# Patient Record
Sex: Female | Born: 1975 | Race: White | Hispanic: No | Marital: Married | Smoking: Former smoker
Health system: Southern US, Community
[De-identification: ages and names within clinical notes are randomized; demographics above are authoritative.]

## PROBLEM LIST (undated history)

## (undated) VITALS — BP 112/68 | HR 106 | Temp 98.2°F | Resp 18 | Ht 59.25 in | Wt 156.0 lb

## (undated) DIAGNOSIS — F53 Postpartum depression: Secondary | ICD-10-CM

## (undated) DIAGNOSIS — O99345 Other mental disorders complicating the puerperium: Secondary | ICD-10-CM

## (undated) DIAGNOSIS — O169 Unspecified maternal hypertension, unspecified trimester: Secondary | ICD-10-CM

## (undated) DIAGNOSIS — F419 Anxiety disorder, unspecified: Secondary | ICD-10-CM

## (undated) DIAGNOSIS — F32A Depression, unspecified: Secondary | ICD-10-CM

## (undated) DIAGNOSIS — R87619 Unspecified abnormal cytological findings in specimens from cervix uteri: Secondary | ICD-10-CM

## (undated) DIAGNOSIS — F329 Major depressive disorder, single episode, unspecified: Secondary | ICD-10-CM

## (undated) HISTORY — DX: Anxiety disorder, unspecified: F41.9

## (undated) HISTORY — DX: Other mental disorders complicating the puerperium: O99.345

## (undated) HISTORY — DX: Depression, unspecified: F32.A

## (undated) HISTORY — DX: Major depressive disorder, single episode, unspecified: F32.9

## (undated) HISTORY — DX: Unspecified abnormal cytological findings in specimens from cervix uteri: R87.619

## (undated) HISTORY — DX: Unspecified maternal hypertension, unspecified trimester: O16.9

## (undated) HISTORY — DX: Postpartum depression: F53.0

---

## 2007-10-10 ENCOUNTER — Ambulatory Visit: Payer: Self-pay | Admitting: Family Medicine

## 2007-10-10 DIAGNOSIS — E669 Obesity, unspecified: Secondary | ICD-10-CM

## 2007-10-24 ENCOUNTER — Ambulatory Visit: Payer: Self-pay | Admitting: Family Medicine

## 2007-10-26 DIAGNOSIS — R87619 Unspecified abnormal cytological findings in specimens from cervix uteri: Secondary | ICD-10-CM

## 2007-10-26 HISTORY — DX: Unspecified abnormal cytological findings in specimens from cervix uteri: R87.619

## 2008-08-03 ENCOUNTER — Inpatient Hospital Stay (HOSPITAL_COMMUNITY): Admission: AD | Admit: 2008-08-03 | Discharge: 2008-08-03 | Payer: Self-pay | Admitting: Obstetrics & Gynecology

## 2008-08-13 ENCOUNTER — Inpatient Hospital Stay (HOSPITAL_COMMUNITY): Admission: AD | Admit: 2008-08-13 | Discharge: 2008-08-13 | Payer: Self-pay | Admitting: Obstetrics and Gynecology

## 2008-09-02 ENCOUNTER — Inpatient Hospital Stay (HOSPITAL_COMMUNITY): Admission: AD | Admit: 2008-09-02 | Discharge: 2008-09-02 | Payer: Self-pay | Admitting: Obstetrics and Gynecology

## 2008-09-03 ENCOUNTER — Inpatient Hospital Stay (HOSPITAL_COMMUNITY): Admission: RE | Admit: 2008-09-03 | Discharge: 2008-09-06 | Payer: Self-pay | Admitting: Obstetrics and Gynecology

## 2008-09-09 ENCOUNTER — Encounter: Admission: RE | Admit: 2008-09-09 | Discharge: 2008-10-08 | Payer: Self-pay | Admitting: Obstetrics and Gynecology

## 2008-10-09 ENCOUNTER — Encounter: Admission: RE | Admit: 2008-10-09 | Discharge: 2008-11-08 | Payer: Self-pay | Admitting: Obstetrics and Gynecology

## 2008-11-09 ENCOUNTER — Encounter: Admission: RE | Admit: 2008-11-09 | Discharge: 2008-12-08 | Payer: Self-pay | Admitting: Obstetrics and Gynecology

## 2008-12-09 ENCOUNTER — Encounter: Admission: RE | Admit: 2008-12-09 | Discharge: 2009-01-08 | Payer: Self-pay | Admitting: Obstetrics and Gynecology

## 2009-01-09 ENCOUNTER — Encounter: Admission: RE | Admit: 2009-01-09 | Discharge: 2009-02-08 | Payer: Self-pay | Admitting: Obstetrics and Gynecology

## 2009-02-09 ENCOUNTER — Encounter: Admission: RE | Admit: 2009-02-09 | Discharge: 2009-02-25 | Payer: Self-pay | Admitting: Obstetrics and Gynecology

## 2009-12-15 ENCOUNTER — Inpatient Hospital Stay (HOSPITAL_COMMUNITY): Admission: AD | Admit: 2009-12-15 | Discharge: 2009-12-19 | Payer: Self-pay | Admitting: Obstetrics and Gynecology

## 2011-03-27 LAB — COMPREHENSIVE METABOLIC PANEL
AST: 14 U/L (ref 0–37)
Alkaline Phosphatase: 59 U/L (ref 39–117)
Alkaline Phosphatase: 75 U/L (ref 39–117)
BUN: 4 mg/dL — ABNORMAL LOW (ref 6–23)
BUN: 5 mg/dL — ABNORMAL LOW (ref 6–23)
CO2: 25 mEq/L (ref 19–32)
CO2: 28 mEq/L (ref 19–32)
Chloride: 104 mEq/L (ref 96–112)
Chloride: 104 mEq/L (ref 96–112)
Creatinine, Ser: 0.42 mg/dL (ref 0.4–1.2)
GFR calc Af Amer: >60 mL/min (ref >60)
GFR calc non Af Amer: >60 mL/min (ref >60)
GFR calc non Af Amer: >60 mL/min (ref >60)
Glucose, Bld: 94 mg/dL (ref 70–99)
Potassium: 3.6 mEq/L (ref 3.5–5.1)
Potassium: 3.7 mEq/L (ref 3.5–5.1)
Total Bilirubin: 0.2 mg/dL — ABNORMAL LOW (ref 0.3–1.2)
Total Bilirubin: 0.4 mg/dL (ref 0.3–1.2)

## 2011-03-27 LAB — CBC
HCT: 27.2 % — ABNORMAL LOW (ref 36.0–46.0)
HCT: 32.3 % — ABNORMAL LOW (ref 36.0–46.0)
Hemoglobin: 11.1 g/dL — ABNORMAL LOW (ref 12.0–15.0)
MCHC: 34.4 g/dL (ref 30.0–36.0)
MCV: 93 fL (ref 78.0–100.0)
MCV: 94.1 fL (ref 78.0–100.0)
Platelets: 139 10*3/uL — ABNORMAL LOW (ref 150–400)
RBC: 2.89 MIL/uL — ABNORMAL LOW (ref 3.87–5.11)
RBC: 2.95 MIL/uL — ABNORMAL LOW (ref 3.87–5.11)
RBC: 3.46 MIL/uL — ABNORMAL LOW (ref 3.87–5.11)
RDW: 12.6 % (ref 11.5–15.5)
RDW: 13 % (ref 11.5–15.5)
WBC: 10.4 10*3/uL (ref 4.0–10.5)
WBC: 15.6 10*3/uL — ABNORMAL HIGH (ref 4.0–10.5)
WBC: 8.4 10*3/uL (ref 4.0–10.5)

## 2011-03-27 LAB — URINALYSIS, ROUTINE W REFLEX MICROSCOPIC
Glucose, UA: NEGATIVE mg/dL
Ketones, ur: 15 mg/dL — AB
pH: 7.5 (ref 5.0–8.0)

## 2011-03-27 LAB — URIC ACID: Uric Acid, Serum: 3.3 mg/dL (ref 2.4–7.0)

## 2011-03-27 LAB — LACTATE DEHYDROGENASE: LDH: 121 U/L (ref 94–250)
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
## 2011-05-09 NOTE — Discharge Summary (Signed)
[name], [name]             ACCOUNT NO.:  [id number]   MEDICAL RECORD NO.:  [id number]          PATIENT TYPE:  INP   LOCATION:  9129                          [facility]ILITY:  [facility]   PHYSICIAN:  Crist Fat. Rivard, M.D. DATE OF BIRTH:  1976/01/13   DATE OF ADMISSION:  09/03/2008  DATE OF DISCHARGE:  09/06/2008                               DISCHARGE SUMMARY   ADMITTING DIAGNOSES:  1. Intrauterine pregnancy at 39 weeks.  2. Pregnancy-induced hypertension with the patient on labetalol 600 mg      p.o. t.i.d. prior to delivery.  3. Breech presentation   DISCHARGE DIAGNOSES:  1. Term intrauterine gestation.  2. Gestational hypertension.  3. Footling breech presentation.   PROCEDURE:  1. Primary low transverse cesarean section.  2. Spinal anesthesia.   HOSPITAL COURSE:  Ms. [name] is a 35 year old gravida 1, para 0 who  presented at 61 weeks' gestation for scheduled cesarean section  secondary to breech presentation.  The patient has been followed at  [facility] with the pregnancy complicated by gestational  hypertension and on labetalol 600 mg p.o. t.i.d.  The patient was taken  to the operating room where a primary low transverse cesarean section  was performed by Dr. Zack Seal under spinal anesthesia.  Findings were  a viable female by the name  [name] called [name], weight 6 pounds 13  ounces.  Apgars were 8 and 9.  The patient tolerated procedure well and  was taken to recovery in good condition.  Infant was taken to the full-  term nursery.  In the immediate postoperative period, the patient's  blood pressure was 167/114.  She received labetalol IV, blood pressure  then was 171/102.  She then got 20 mg of labetalol, blood pressure then  was 162/100, her diastolics remained between 100 and 114.  Labetalol was  increased to 800 mg p.o. t.i.d. and she received a dose of hydralazine 5  mg IV.  Her blood pressure seemed to be under better control subsequent  to  that.  By postop day #1, the patient was complaining of a mild  headache but had no other PIH symptoms.  Her blood pressures were in the  130s-170s over 73-114.  Hemoglobin was 9.6 down from 11.6, white blood  cell count was 11.1 and platelet count was 133.  It had been 158 prior  to delivery.  She was having good urinary output.  Her incision was  clean, dry and intact.  Her uterus was firm.  She was breast-feeding.  She did have a history of depression in the past.  By later that day  blood pressures run in 160s/90s.  Dr. Estanislado Pandy saw the patient and had  Procardia 30 mg XL daily.  Through the rest the afternoon of the  September 04, 2008, she began to have more trouble with blurred vision  and headache.  Blood pressure was 159/111.  A normal saline lock was  initiated and Apresoline was given IV, urine was negative for protein.  By the morning of the September 05, 2008, her blood pressures were still  in the 140s-160s over  90s-111.  She was up ad lib, tolerating p.o. diet,  ambulating and voiding without difficulty.  Her physical exam was within  normal limits.  She had a comprehensive metabolic panel done on the  evening of September 04, 2008, showing uric acid of 5.2, SGOT of 22,  SGPT of 19, platelet count was 163.  She was offered Zoloft for  depression; however, she declined this.  Her Procardia dose was  increased to 90 mg XL p.o. daily and labetalol 800 mg p.o. t.i.d.  By  the morning of September 06, 2008, her blood pressures were 116/78,  131/80 and 142/98.  Her other vital signs were stable.  Her physical  exam was within normal limits.  She was doing well, was up ad lib.  Her  incision was clean, dry and intact.  She was breast-feeding well.  She  was deemed to receive full benefit of her hospital stay and was  discharged home after consult with Dr. Estanislado Pandy.   DISCHARGE INSTRUCTIONS:  Per [facility] handout as well as PIH  precautions were discussed.   DISCHARGE  MEDICATIONS:  1. Procardia 90 mg XL 1 p.o. daily.  2. Labetalol 800 mg 1 p.o. t.i.d.  3. Percocet 5/325 1-2 p.o. q.3-4 h. p.r.n. pain.  4. Motrin 600 mg p.o. q.6 h. p.r.n. pain.  She was undecided regarding      birth control.   DISCHARGE FOLLOWUP:  Will occur by [employer] nurse on Tuesday,  September 08, 2008, and at [facility] p.r.n. and in 6 weeks.  The [employer] nurse will call the office with a blood pressure  results.      Renaldo Reel Emilee Hero, C.N.M.      Crist Fat Rivard, M.D.  Electronically Signed    VLL/MEDQ  D:  09/06/2008  T:  09/07/2008  Job:  [id number]

## 2011-05-09 NOTE — Op Note (Signed)
[name], [name]             ACCOUNT NO.:  [id number]   MEDICAL RECORD NO.:  [id number]          PATIENT TYPE:  MAT   LOCATION:  MATC                          [facility]ILITY:  [facility]   PHYSICIAN:  Janine Limbo, M.D.DATE OF BIRTH:  February 23, 1976   DATE OF PROCEDURE:  DATE OF DISCHARGE:                               OPERATIVE REPORT   PREOPERATIVE DIAGNOSES:  1. Term intrauterine gestation.  2. Gestational hypertension.  3. Breech presentation.   POSTOPERATIVE DIAGNOSES:  1. Term intrauterine gestation.  2. Gestational hypertension.  3. Footling breech presentation.   PROCEDURE:  Primary low transverse cesarean section.   SURGEON:  Janine Limbo, M.D.   FIRST ASSISTANT:  Renaldo Reel. Emilee Hero, C.N.M.   ANESTHETIC:  Spinal.   DISPOSITION:  [name] is a 35 year old female, gravida 1, para 0,  who presents at [redacted] weeks gestation.  The patient has been followed at  the [facility] and Gynecology division of [facility] for women.  This pregnancy has been complicated by  gestational hypertension.  The patient currently takes labetalol 600 mg  3 times each day.  The patient understands the indications for her  surgical procedure and she accepts the risk of, but not limited to,  anesthetic complications, bleeding, infection, and possible damage to  the surrounding organs.   FINDINGS:  A 6 pound 13 ounce female infant [name]) was delivered  from a double footling breech presentation.  The Apgars were 8 at 1  minutes and 9 at 5 minutes.  The uterus, fallopian tubes, and the  ovaries were normal for the gravid state.   PROCEDURE:  The patient was seen preoperatively and an ultrasound was  performed.  She was noted to have a single intrauterine gestation with a  breech presentation.  The fetal head was in the left upper quadrant.  The amniotic fluid volume was normal.  The placenta appeared normal.  Fetal heart motions were normal.  The patient was  then taken to the  labor and delivery suite.  A spinal anesthetic was given.  The patient's  abdomen, perineum, and outer vagina were prepped with multiple layers of  Betadine.  A Foley catheter was placed in the bladder.  The patient was  sterilely draped.  The lower abdomen was injected with 10 mL of 0.5%  Marcaine with epinephrine.  A low transverse incision was made in the  abdomen and carried sharply through the subcutaneous tissue, the fascia,  and the anterior peritoneum.  An incision was made in the lower uterine  segment and extended in a low transverse fashion.  The infant was  delivered from a double footling breech presentation without difficulty.  The mouth and nose were suctioned.  The cord was clamped and cut and the  infant was handed to the waiting pediatric team.  Routine cord blood  studies were obtained.  The placenta was removed.  The uterine cavity  was cleaned of amniotic fluid, clotted blood, and membranes.  The  uterine incision was closed using a running locking suture of 2-0 Vicryl  followed by an imbricating suture of 2-0  Vicryl.  The pelvis was  vigorously irrigated.  Hemostasis was adequate.  The anterior peritoneum  and then abdominal musculature were reapproximated in the midline using  2-0 Vicryl.  The fascia was closed using a running suture of 0-Vicryl  followed by three interrupted sutures of 0-Vicryl.  The subcutaneous  layer was closed using interrupted sutures of 2-0 Vicryl.  The skin was  reapproximated using a subcuticular suture of 2-0 Monocryl.  Sponge,  needle, instrument counts were correct on two occasions.  The estimated  blood loss for the procedure was 500 mL.  The patient tolerated her  procedure well.  She was taken to the recovery room in stable condition.  The infant was taken to the full-term nursery in stable condition.  The  placenta was sent to labor and delivery.  Wound classification is 2.  There were no complications and no  equipment failures.      Janine Limbo, M.D.  Electronically Signed     AVS/MEDQ  D:  09/03/2008  T:  09/03/2008  Job:  [id number]

## 2011-09-22 LAB — COMPREHENSIVE METABOLIC PANEL
AST: 22
Albumin: 2.9 — ABNORMAL LOW
BUN: 7
Calcium: 8.9
Chloride: 106
Creatinine, Ser: 0.51
GFR calc Af Amer: >60
Total Protein: 6.4

## 2011-09-22 LAB — CBC
HCT: 33.7 — ABNORMAL LOW
MCV: 94.6
Platelets: 173
RDW: 12.3
WBC: 12.2 — ABNORMAL HIGH

## 2011-09-22 LAB — URIC ACID: Uric Acid, Serum: 3.9

## 2011-09-27 LAB — CBC
HCT: 28.3 — ABNORMAL LOW
HCT: 29.4 — ABNORMAL LOW
MCHC: 34.1
MCV: 95.1
MCV: 95.1
MCV: 95.3
Platelets: 158
Platelets: 163
RBC: 2.98 — ABNORMAL LOW
RBC: 3.57 — ABNORMAL LOW
RDW: 12.7
WBC: 11.1 — ABNORMAL HIGH
WBC: 11.1 — ABNORMAL HIGH

## 2011-09-27 LAB — COMPREHENSIVE METABOLIC PANEL
ALT: 23
AST: 25
Albumin: 2.6 — ABNORMAL LOW
Albumin: 2.9 — ABNORMAL LOW
Alkaline Phosphatase: 68
BUN: 6
CO2: 22
Calcium: 8.8
Calcium: 8.8
Chloride: 101
Creatinine, Ser: 0.53
Creatinine, Ser: 0.56
GFR calc Af Amer: >60
Potassium: 3.8
Sodium: 133 — ABNORMAL LOW
Total Bilirubin: 0.3
Total Protein: 5.8 — ABNORMAL LOW

## 2011-09-27 LAB — URINALYSIS, ROUTINE W REFLEX MICROSCOPIC
Glucose, UA: NEGATIVE
Glucose, UA: NEGATIVE
Hgb urine dipstick: NEGATIVE
Ketones, ur: NEGATIVE
Nitrite: NEGATIVE
Specific Gravity, Urine: 1.007
Specific Gravity, Urine: <1.005 — ABNORMAL LOW
pH: 6.5
pH: 6.5

## 2011-09-27 LAB — RAPID HIV SCREEN (WH-MAU): Rapid HIV Screen: NONREACTIVE

## 2011-09-27 LAB — LACTATE DEHYDROGENASE: LDH: 152

## 2011-09-27 LAB — PROTEIN, URINE, RANDOM: Total Protein, Urine: <6

## 2011-09-27 LAB — URIC ACID: Uric Acid, Serum: 5.2

## 2012-01-10 ENCOUNTER — Encounter: Payer: Self-pay | Admitting: Physician Assistant

## 2012-01-10 DIAGNOSIS — F419 Anxiety disorder, unspecified: Secondary | ICD-10-CM | POA: Insufficient documentation

## 2012-01-31 ENCOUNTER — Telehealth: Payer: Self-pay | Admitting: Physician Assistant

## 2012-01-31 MED ORDER — ALPRAZOLAM 0.25 MG PO TABS: 0.2500 mg | ORAL_TABLET | Freq: Two times a day (BID) | ORAL | Status: AC | PRN

## 2012-01-31 NOTE — Telephone Encounter (Signed)
Ok to refill x 1.  [name]

## 2012-03-30 ENCOUNTER — Other Ambulatory Visit: Payer: Self-pay | Admitting: Physician Assistant

## 2012-04-04 ENCOUNTER — Telehealth: Payer: Self-pay

## 2012-04-04 MED ORDER — ALPRAZOLAM 0.25 MG PO TABS: 0.2500 mg | ORAL_TABLET | Freq: Two times a day (BID) | ORAL | Status: AC | PRN

## 2012-04-04 NOTE — Telephone Encounter (Signed)
Pharmacy is calling in rx request for xanax .25 #[phone] [facility]

## 2012-04-04 NOTE — Telephone Encounter (Signed)
Chart is at nurses station for review MR [id number]

## 2012-04-04 NOTE — Telephone Encounter (Signed)
Done. Please call in.  [name]

## 2012-04-07 NOTE — Telephone Encounter (Signed)
RX CALLED IN

## 2012-04-24 ENCOUNTER — Telehealth: Payer: Self-pay

## 2012-04-24 NOTE — Telephone Encounter (Signed)
How long has patient been out? We may need to titrate her back up to her regular dose? What is her follow up plan?

## 2012-04-24 NOTE — Telephone Encounter (Signed)
PT WOULD LIKE A REFILL ON GENERIC ZOLOFT CALLED INTO THE [facility] [address]. PT STATES THAT SHE HAS BEEN IN OUT FOR A WHILE NOW.

## 2012-04-24 NOTE — Telephone Encounter (Signed)
Chart pulled to PA 

## 2012-04-25 MED ORDER — SERTRALINE HCL 100 MG PO TABS: 100.0000 mg | ORAL_TABLET | Freq: Every day | ORAL | Status: DC

## 2012-04-25 NOTE — Telephone Encounter (Signed)
Pt CB and reported that she has only missed one day bc her pharmacy gave her some to hold her over. Pt stated that she knows she is due for f/up. I told her that we well RF 1 mos but then she needs OV for more. Pt agreed and transferred her to appt center for appt.

## 2012-04-25 NOTE — Telephone Encounter (Signed)
LMOM to CB. 

## 2012-05-05 ENCOUNTER — Other Ambulatory Visit: Payer: Self-pay | Admitting: Family Medicine

## 2012-05-23 ENCOUNTER — Encounter: Payer: Self-pay | Admitting: Physician Assistant

## 2012-05-23 ENCOUNTER — Ambulatory Visit (INDEPENDENT_AMBULATORY_CARE_PROVIDER_SITE_OTHER): Payer: BC Managed Care – PPO | Admitting: Physician Assistant

## 2012-05-23 VITALS — BP 132/88 | HR 86 | Temp 97.6°F | Resp 16 | Ht 59.0 in | Wt 157.6 lb

## 2012-05-23 DIAGNOSIS — F3281 Premenstrual dysphoric disorder: Secondary | ICD-10-CM

## 2012-05-23 DIAGNOSIS — F329 Major depressive disorder, single episode, unspecified: Secondary | ICD-10-CM

## 2012-05-23 DIAGNOSIS — I10 Essential (primary) hypertension: Secondary | ICD-10-CM

## 2012-05-23 DIAGNOSIS — N943 Premenstrual tension syndrome: Secondary | ICD-10-CM

## 2012-05-23 MED ORDER — BUPROPION HCL ER (XL) 150 MG PO TB24: 150.0000 mg | ORAL_TABLET | Freq: Every day | ORAL | Status: DC

## 2012-05-23 MED ORDER — SERTRALINE HCL 100 MG PO TABS: 100.0000 mg | ORAL_TABLET | Freq: Every day | ORAL | Status: DC

## 2012-05-23 MED ORDER — NORETHINDRONE 0.35 MG PO TABS: 1.0000 | ORAL_TABLET | Freq: Every day | ORAL | Status: DC

## 2012-05-23 MED ORDER — ALPRAZOLAM 0.25 MG PO TABS: 0.2500 mg | ORAL_TABLET | Freq: Every evening | ORAL | Status: DC | PRN

## 2012-05-23 NOTE — Progress Notes (Signed)
  Subjective:    Patient ID: [name], female    DOB: [birth year], 36 y.o.   MRN: [id number]  HPI Presents for follow-up of depression, PMDD, weight gain, and HTN. Wants to get off as many meds as she can.  Doesn't feel like meds are helping much, if at all.  Wellbutrin makes her irritable. Exercising regularly has helped a lot, and she reports generally feeling well except for the week before her period when she feels weepy, more irritable and depressed, and even sometimes has SI (though she notes that she has too many people depending on her to consider suicide).  Her husband will lose his job in August, and with it their family health insurance. She's concerned about how she'll be able to manage her health issues after that.   Review of Systems As above.    Objective:   Physical Exam  Vital signs noted. Well-developed, well nourished WF who is awake, alert and oriented, in NAD. HEENT: [place]/AT, sclera and conjunctiva are clear.   Neck: supple, non-tender, no lymphadenopathy, thyromegaly. Heart: RRR, no murmur Lungs: CTA Skin: warm and dry without rash.     Assessment & Plan:   1. PMDD (premenstrual dysphoric disorder)  buPROPion (WELLBUTRIN XL) 150 MG 24 hr tablet, sertraline (ZOLOFT) 100 MG tablet, ALPRAZolam (XANAX) 0.25 MG tablet, norethindrone (MICRONOR,CAMILA,ERRIN) 0.35 MG tablet  2. Depression  buPROPion (WELLBUTRIN XL) 150 MG 24 hr tablet, sertraline (ZOLOFT) 100 MG tablet, ALPRAZolam (XANAX) 0.25 MG tablet  3. HTN (hypertension)  Continue lisinopril and efforts for healthy lifestyle.   Hopefully suppressing ovulation will reduce her PMDD symptoms.  She's also considering a Mirena IUD and I'm happy to refer her to GYN if she elects to go that route.  We'll reduce the Wellbutrin from 300 to 150 mg today, and then either D/C it or reduce her sertraline dose next.  Follow-up in 2 months, sooner if needed.

## 2012-06-22 ENCOUNTER — Other Ambulatory Visit: Payer: Self-pay | Admitting: Family Medicine

## 2012-07-18 ENCOUNTER — Encounter (HOSPITAL_COMMUNITY): Payer: Self-pay | Admitting: *Deleted

## 2012-07-18 ENCOUNTER — Emergency Department (HOSPITAL_COMMUNITY)
Admission: EM | Admit: 2012-07-18 | Discharge: 2012-07-19 | Disposition: A | Discharge status: Other Acute Inpatient Hospital | Payer: BC Managed Care – PPO | Attending: Emergency Medicine | Admitting: Emergency Medicine

## 2012-07-18 DIAGNOSIS — T398X2A Poisoning by other nonopioid analgesics and antipyretics, not elsewhere classified, intentional self-harm, initial encounter: Secondary | ICD-10-CM | POA: Insufficient documentation

## 2012-07-18 DIAGNOSIS — Y92009 Unspecified place in unspecified non-institutional (private) residence as the place of occurrence of the external cause: Secondary | ICD-10-CM | POA: Insufficient documentation

## 2012-07-18 DIAGNOSIS — T46904A Poisoning by unspecified agents primarily affecting the cardiovascular system, undetermined, initial encounter: Secondary | ICD-10-CM | POA: Insufficient documentation

## 2012-07-18 DIAGNOSIS — T50992A Poisoning by other drugs, medicaments and biological substances, intentional self-harm, initial encounter: Secondary | ICD-10-CM | POA: Insufficient documentation

## 2012-07-18 DIAGNOSIS — F341 Dysthymic disorder: Secondary | ICD-10-CM | POA: Insufficient documentation

## 2012-07-18 DIAGNOSIS — T43502A Poisoning by unspecified antipsychotics and neuroleptics, intentional self-harm, initial encounter: Secondary | ICD-10-CM | POA: Insufficient documentation

## 2012-07-18 DIAGNOSIS — T424X4A Poisoning by benzodiazepines, undetermined, initial encounter: Secondary | ICD-10-CM | POA: Insufficient documentation

## 2012-07-18 DIAGNOSIS — T40601A Poisoning by unspecified narcotics, accidental (unintentional), initial encounter: Secondary | ICD-10-CM | POA: Insufficient documentation

## 2012-07-18 DIAGNOSIS — F329 Major depressive disorder, single episode, unspecified: Secondary | ICD-10-CM

## 2012-07-18 DIAGNOSIS — R4589 Other symptoms and signs involving emotional state: Secondary | ICD-10-CM

## 2012-07-18 DIAGNOSIS — F32A Depression, unspecified: Secondary | ICD-10-CM

## 2012-07-18 DIAGNOSIS — Z87891 Personal history of nicotine dependence: Secondary | ICD-10-CM | POA: Insufficient documentation

## 2012-07-18 DIAGNOSIS — T394X2A Poisoning by antirheumatics, not elsewhere classified, intentional self-harm, initial encounter: Secondary | ICD-10-CM | POA: Insufficient documentation

## 2012-07-18 LAB — COMPREHENSIVE METABOLIC PANEL
AST: 13 U/L (ref 0–37)
Albumin: 3.8 g/dL (ref 3.5–5.2)
Alkaline Phosphatase: 36 U/L — ABNORMAL LOW (ref 39–117)
Chloride: 104 mEq/L (ref 96–112)
Creatinine, Ser: 0.72 mg/dL (ref 0.50–1.10)
Potassium: 3.8 mEq/L (ref 3.5–5.1)
Total Bilirubin: 0.3 mg/dL (ref 0.3–1.2)
Total Protein: 6.8 g/dL (ref 6.0–8.3)

## 2012-07-18 LAB — RAPID URINE DRUG SCREEN, HOSP PERFORMED
Amphetamines: NOT DETECTED
Barbiturates: NOT DETECTED
Benzodiazepines: POSITIVE — AB
Cocaine: NOT DETECTED
Tetrahydrocannabinol: POSITIVE — AB

## 2012-07-18 LAB — GLUCOSE, CAPILLARY: Glucose-Capillary: 88 mg/dL (ref 70–99)

## 2012-07-18 LAB — CBC
MCHC: 34.1 g/dL (ref 30.0–36.0)
Platelets: 209 10*3/uL (ref 150–400)
RDW: 12.4 % (ref 11.5–15.5)
WBC: 5.5 10*3/uL (ref 4.0–10.5)

## 2012-07-18 LAB — POCT PREGNANCY, URINE: Preg Test, Ur: NEGATIVE

## 2012-07-18 MED ORDER — SODIUM CHLORIDE 0.9 % IV BOLUS (SEPSIS)
1000.0000 mL | Freq: Once | INTRAVENOUS | Status: AC
  Administered 2012-07-18: 1000 mL via INTRAVENOUS

## 2012-07-18 MED ORDER — ONDANSETRON HCL 4 MG PO TABS: 4.0000 mg | ORAL_TABLET | Freq: Three times a day (TID) | ORAL | Status: DC | PRN

## 2012-07-18 NOTE — ED Provider Notes (Signed)
History    CSN: [id number] Arrival date & time 07/18/12  1415 First MD Initiated Contact with Patient 07/18/12 1501    Chief Complaint  Patient presents with  . Drug Overdose   Patient is a 36 y.o. female presenting with Overdose. The history is provided by the patient.  Drug Overdose Pertinent negatives include no abdominal pain, no headaches and no shortness of breath. Nothing relieves the symptoms. She has tried nothing for the symptoms. The treatment provided no relief.   Pt has been depressed for a long period of time.  She was upset today, no specific inciting factors.  At  9am she took 6 lisinopril, 3 nyquil, 2 hydrocodone and 20 xanax pills.  Pt was found by her husband and he called 911.  Pt was attempting to kill herself.  She has never done this before.  Pt denies chest pain, nausea or vomiting.  She does feel tired and upset still.  Pt is also upset because she has been trying to lose weight by eating right and exercising but she has gained weight.   Past Medical History  Diagnosis Date  . Anxiety and depression   . Abnormal Pap smear of cervix 10/2007    colposcopy 12/2007  . Post partum depression   . HTN complicating peripregnancy, antepartum    No past surgical history on file.  No family history on file.  History  Substance Use Topics  . Smoking status: Former Games developer  . Smokeless tobacco: Not on file  . Alcohol Use: Not on file    OB History    Grav Para Term Preterm Abortions TAB SAB Ect Mult Living                 Review of Systems  Respiratory: Negative for shortness of breath.   Gastrointestinal: Negative for abdominal pain.  Neurological: Negative for headaches.  All other systems reviewed and are negative.   Allergies  Amoxicillin  Home Medications   Current Outpatient Rx  Name Route Sig Dispense Refill  . ALPRAZOLAM 0.25 MG PO TABS Oral Take 0.25 mg by mouth at bedtime as needed.    . BUPROPION HCL ER (XL) 150 MG PO TB24 Oral Take 150 mg by  mouth daily.    Marland Kitchen LISINOPRIL 5 MG PO TABS Oral Take 5 mg by mouth daily.    . NORETHINDRONE 0.35 MG PO TABS Oral Take 1 tablet (0.35 mg total) by mouth daily. 1 Package 11  . SERTRALINE HCL 100 MG PO TABS Oral Take 100 mg by mouth daily.      BP 102/82  Pulse 64  Temp 98 F (36.7 C) (Oral)  Resp 16  SpO2 100%  Physical Exam  Nursing note and vitals reviewed. Constitutional: She appears well-developed and well-nourished. No distress.  HENT:  Head: Normocephalic and atraumatic.  Right Ear: External ear normal.  Left Ear: External ear normal.  Eyes: Conjunctivae are normal. Right eye exhibits no discharge. Left eye exhibits no discharge. No scleral icterus.  Neck: Neck supple. No tracheal deviation present.  Cardiovascular: Normal rate, regular rhythm and intact distal pulses.   Pulmonary/Chest: Effort normal and breath sounds normal. No stridor. No respiratory distress. She has no wheezes. She has no rales.  Abdominal: Soft. Bowel sounds are normal. She exhibits no distension. There is no tenderness. There is no rebound and no guarding.  Musculoskeletal: She exhibits no edema and no tenderness.  Neurological: She is alert. She has normal strength. No sensory deficit. Cranial nerve  deficit:  no gross defecits noted. She exhibits normal muscle tone. She displays no seizure activity. Coordination normal.  Skin: Skin is warm and dry. No rash noted.  Psychiatric: Her speech is not rapid and/or pressured, not delayed and not slurred. She is not agitated, not aggressive, not slowed and not withdrawn. Cognition and memory are not impaired. She expresses inappropriate judgment. She exhibits a depressed mood. She expresses suicidal ideation.       tearful    ED Course  Procedures (including critical care time)  Rate: 66  Rhythm: normal sinus rhythm  QRS Axis: normal  Intervals: normal  ST/T Wave abnormalities: normal  Conduction Disutrbances:none  Narrative Interpretation:   Old EKG  Reviewed: none available  Labs Reviewed  COMPREHENSIVE METABOLIC PANEL - Abnormal; Notable for the following:    Alkaline Phosphatase 36 (*)     All other components within normal limits  URINE RAPID DRUG SCREEN (HOSP PERFORMED) - Abnormal; Notable for the following:    Benzodiazepines POSITIVE (*)     Tetrahydrocannabinol POSITIVE (*)     All other components within normal limits  SALICYLATE LEVEL - Abnormal; Notable for the following:    Salicylate Lvl <2.0 (*)     All other components within normal limits  GLUCOSE, CAPILLARY  CBC  ETHANOL  ACETAMINOPHEN LEVEL  POCT PREGNANCY, URINE   No results found.    MDM  3:44 PM Discussed with poison center.  Would not expect worsening toxicity at this time.  If pt is asymptomatic and vitals are stable pt should be cleared for psychiatric evaluation.  Bupropion could cause delayed seizure and would require 24 hour admission if she took it.  4:40 PM  Pt remains medically stable.  Denies other ingestion.  Clear for psych eval.        Celene Kras, MD 07/18/12 [phone]

## 2012-07-18 NOTE — ED Notes (Signed)
Poison control called. rn gave vitals and answered poison controls questions. Pt alert and oriented x4. Respirations even and unlabored, bilateral symmetrical rise and fall of chest. Skin warm and dry. In no acute distress. Denies needs.

## 2012-07-18 NOTE — ED Notes (Signed)
Per ems pt is from home. Alert and oriented x4, ambulatory. Pt reports she is depressed, tried to hurt herself/kill herself reports 4 hours ago ttaking blood pressure meds x6 pills. nyquil pills x3 pills, xanax x20 pills. Hydrocodone x2 pills. Husbands pt found pt and called ems. 20 g R hand

## 2012-07-18 NOTE — ED Notes (Signed)
Patient has been changed into hospital scrubs. Patient's belongings at nurse's station in the blue zone.

## 2012-07-18 NOTE — ED Notes (Signed)
md at bedside  Pt alert and oriented x4. Respirations even and unlabored, bilateral symmetrical rise and fall of chest. Skin warm and dry. In no acute distress. Denies needs.   

## 2012-07-18 NOTE — ED Notes (Signed)
Pt is still in room ED 13 has not been moved yet. Charting error.

## 2012-07-18 NOTE — Progress Notes (Signed)
Pt confirms she sees [name] at [facility] Updated EPIC

## 2012-07-18 NOTE — ED Notes (Signed)
ZOX:WR60<AV> Expected date:07/18/12<BR> Expected time: 2:09 PM<BR> Means of arrival:Ambulance<BR> Comments:<BR> overdose

## 2012-07-18 NOTE — ED Notes (Signed)
Patient has one bag of belongings in locker 43. 

## 2012-07-18 NOTE — ED Notes (Signed)
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  Had OD attempt today.  Denies HI.  Says she has been depressed with SI for all of her life on and off.

## 2012-07-19 ENCOUNTER — Encounter (HOSPITAL_COMMUNITY): Payer: Self-pay

## 2012-07-19 ENCOUNTER — Inpatient Hospital Stay (HOSPITAL_COMMUNITY)
Admission: EM | Admit: 2012-07-19 | Discharge: 2012-07-22 | DRG: 430 | Disposition: A | Discharge status: Home / Self Care | Payer: BC Managed Care – PPO | Source: Ambulatory Visit | Attending: Psychiatry | Admitting: Psychiatry

## 2012-07-19 DIAGNOSIS — F332 Major depressive disorder, recurrent severe without psychotic features: Principal | ICD-10-CM | POA: Diagnosis present

## 2012-07-19 DIAGNOSIS — F419 Anxiety disorder, unspecified: Secondary | ICD-10-CM

## 2012-07-19 DIAGNOSIS — F411 Generalized anxiety disorder: Secondary | ICD-10-CM

## 2012-07-19 DIAGNOSIS — F3281 Premenstrual dysphoric disorder: Secondary | ICD-10-CM

## 2012-07-19 DIAGNOSIS — T424X5A Adverse effect of benzodiazepines, initial encounter: Secondary | ICD-10-CM | POA: Diagnosis present

## 2012-07-19 DIAGNOSIS — F1210 Cannabis abuse, uncomplicated: Secondary | ICD-10-CM | POA: Clinically undetermined

## 2012-07-19 DIAGNOSIS — Z79899 Other long term (current) drug therapy: Secondary | ICD-10-CM

## 2012-07-19 MED ORDER — ACETAMINOPHEN 325 MG PO TABS: 650.0000 mg | ORAL_TABLET | Freq: Four times a day (QID) | ORAL | Status: DC | PRN

## 2012-07-19 MED ORDER — ALUM & MAG HYDROXIDE-SIMETH 200-200-20 MG/5ML PO SUSP: 30.0000 mL | ORAL | Status: DC | PRN

## 2012-07-19 MED ORDER — GABAPENTIN 100 MG PO CAPS
100.0000 mg | ORAL_CAPSULE | Freq: Three times a day (TID) | ORAL | Status: DC
  Administered 2012-07-20 – 2012-07-22 (×8): 100 mg via ORAL
  Filled 2012-07-19 (×13): qty 1

## 2012-07-19 MED ORDER — HYDROXYZINE HCL 25 MG PO TABS: 25.0000 mg | ORAL_TABLET | Freq: Three times a day (TID) | ORAL | Status: DC | PRN

## 2012-07-19 MED ORDER — SERTRALINE HCL 100 MG PO TABS
100.0000 mg | ORAL_TABLET | Freq: Every day | ORAL | Status: DC
  Administered 2012-07-19 – 2012-07-22 (×4): 100 mg via ORAL
  Filled 2012-07-19 (×5): qty 1

## 2012-07-19 MED ORDER — NORETHINDRONE 0.35 MG PO TABS
1.0000 | ORAL_TABLET | Freq: Every day | ORAL | Status: DC
  Administered 2012-07-20 – 2012-07-22 (×3): 0.35 mg via ORAL

## 2012-07-19 MED ORDER — CHLORPROMAZINE HCL 25 MG PO TABS
25.0000 mg | ORAL_TABLET | Freq: Three times a day (TID) | ORAL | Status: DC
  Administered 2012-07-19 – 2012-07-22 (×10): 25 mg via ORAL
  Filled 2012-07-19 (×15): qty 1

## 2012-07-19 MED ORDER — LISINOPRIL 5 MG PO TABS
5.0000 mg | ORAL_TABLET | Freq: Every day | ORAL | Status: DC
  Administered 2012-07-19 – 2012-07-22 (×4): 5 mg via ORAL
  Filled 2012-07-19 (×6): qty 1

## 2012-07-19 MED ORDER — HYDROXYZINE HCL 50 MG PO TABS
50.0000 mg | ORAL_TABLET | Freq: Every evening | ORAL | Status: DC | PRN
  Administered 2012-07-19 – 2012-07-21 (×3): 50 mg via ORAL
  Filled 2012-07-19 (×10): qty 1

## 2012-07-19 MED ORDER — IBUPROFEN 200 MG PO TABS
400.0000 mg | ORAL_TABLET | Freq: Four times a day (QID) | ORAL | Status: DC | PRN
  Administered 2012-07-19 – 2012-07-20 (×2): 400 mg via ORAL
  Filled 2012-07-19 (×3): qty 2

## 2012-07-19 MED ORDER — NORETHINDRONE-ETH ESTRADIOL 0.4-35 MG-MCG PO TABS: 1.0000 | ORAL_TABLET | Freq: Every day | ORAL | Status: DC

## 2012-07-19 MED ORDER — NORETHINDRONE-ETH ESTRADIOL 0.4-35 MG-MCG PO TABS
1.0000 | ORAL_TABLET | Freq: Every day | ORAL | Status: DC
  Administered 2012-07-19: 1 via ORAL

## 2012-07-19 MED ORDER — MAGNESIUM HYDROXIDE 400 MG/5ML PO SUSP: 30.0000 mL | Freq: Every day | ORAL | Status: DC | PRN

## 2012-07-19 MED ORDER — SERTRALINE HCL 100 MG PO TABS: 100.0000 mg | ORAL_TABLET | Freq: Every day | ORAL | Status: DC

## 2012-07-19 NOTE — BH Assessment (Addendum)
Assessment Note   [name] is an 36 y.o. female.    Patient is a married 36 year old white female. Patient reports feelings of wanting to hurt herself due to severe depression. Patient reports that she has been thinking about hurting herself for a long time.  Patient reports that she wrote a suicide note and took pills in order to kill herself.  Patient reports that she does not remember what pills that she took or how many pills she took.  Patient reports that she is not able to contract for safety. Patient reports that she receives medication for depression from her medical doctor.  Patient reports that she does not have a therapist.  Patient reports a history of depression since she was 36 years old.  Patent reports that she was diagnosed with PMMD 3 years ago.     Patient reports that she has been feelings helpless and overwhelmed with taking care of her two children that are 59 and 42 years old.  Patient reports that she cannot do this anymore.  Patient reports stress her marriage due to financial problems.  Patient denied any psychosis.  Patient denies any prior psychiatric hospitalizations. Patient denies any substance abuse issues.  Patient tested positive for Benzos and her alcohol level was less than 11.   Axis I: Major Depression, single episode Axis II: Deferred Axis III:  Past Medical History  Diagnosis Date  . Anxiety and depression   . Abnormal Pap smear of cervix 10/2007    colposcopy 12/2007  . Post partum depression   . HTN complicating peripregnancy, antepartum    Axis IV: economic problems, other psychosocial or environmental problems, problems related to social environment and problems with primary support group Axis V: 11-20 some danger of hurting self or others possible OR occasionally fails to maintain minimal personal hygiene OR gross impairment in communication  Past Medical History:  Past Medical History  Diagnosis Date  . Anxiety and depression   . Abnormal  Pap smear of cervix 10/2007    colposcopy 12/2007  . Post partum depression   . HTN complicating peripregnancy, antepartum     History reviewed. No pertinent past surgical history.  Family History: History reviewed. No pertinent family history.  Social History:  reports that she has quit smoking. She does not have any smokeless tobacco history on file. Her alcohol and drug histories not on file.  Additional Social History:     CIWA: CIWA-Ar BP: 101/68 mmHg Pulse Rate: 72  COWS:    Allergies:  Allergies  Allergen Reactions  . Amoxicillin Nausea Only    Home Medications:  (Not in a hospital admission)  OB/GYN Status:  No LMP recorded.  General Assessment Data Location of Assessment: [facility] Assessment Services ACT Assessment: Yes Living Arrangements: Spouse/significant other Can pt return to current living arrangement?: Yes Admission Status: Voluntary Is patient capable of signing voluntary admission?: Yes Transfer from: Home Referral Source: Self/Family/Friend     Risk to self Suicidal Ideation: Yes-Currently Present Suicidal Intent: Yes-Currently Present Is patient at risk for suicide?: Yes Suicidal Plan?: Yes-Currently Present Specify Current Suicidal Plan: taking pills Access to Means: Yes Specify Access to Suicidal Means: pills at home What has been your use of drugs/alcohol within the last 12 months?: alcohol and marijuana Previous Attempts/Gestures: No How many times?: 0  Other Self Harm Risks: 0 Triggers for Past Attempts: Family contact;Spouse contact;Unpredictable Intentional Self Injurious Behavior: None Family Suicide History: No Recent stressful life event(s): Conflict (Comment);Financial Problems;Trauma (  Comment) Persecutory voices/beliefs?: No Depression: Yes Depression Symptoms: Despondent;Insomnia;Tearfulness;Isolating;Guilt;Fatigue;Loss of interest in usual pleasures;Feeling worthless/self pity Substance abuse history and/or treatment for  substance abuse?: No Suicide prevention information given to non-admitted patients: Yes  Risk to Others Homicidal Ideation: No Thoughts of Harm to Others: No Current Homicidal Intent: No Current Homicidal Plan: No Access to Homicidal Means: No Identified Victim: N/A History of harm to others?: No Assessment of Violence: None Noted Violent Behavior Description: N/A Does patient have access to weapons?: No Criminal Charges Pending?: No Does patient have a court date: No  Psychosis Hallucinations: None noted Delusions: None noted  Mental Status Report Appear/Hygiene: Disheveled Eye Contact: Poor Motor Activity: Restlessness Speech: Soft Level of Consciousness: Quiet/awake Mood: Depressed;Despair;Sad;Sullen;Worthless, low self-esteem Affect: Blunted;Depressed Anxiety Level: Minimal Thought Processes: Coherent;Relevant Judgement: Unimpaired Orientation: Person;Place;Time;Situation Obsessive Compulsive Thoughts/Behaviors: None  Cognitive Functioning Concentration: Decreased Memory: Recent Intact;Remote Intact IQ: Average Insight: Fair Impulse Control: Fair Appetite: Poor Weight Loss: 0  Weight Gain: 0  Sleep: Decreased Total Hours of Sleep: 4  Vegetative Symptoms: Staying in bed  ADLScreening [facility] Assessment Services) Patient's cognitive ability adequate to safely complete daily activities?: Yes Patient able to express need for assistance with ADLs?: Yes Independently performs ADLs?: Yes  Abuse/Neglect [facility]) Physical Abuse: Denies Verbal Abuse: Denies Sexual Abuse: Denies  Prior Inpatient Therapy Prior Inpatient Therapy: No  Prior Outpatient Therapy Prior Outpatient Therapy: No  ADL Screening (condition at time of admission) Patient's cognitive ability adequate to safely complete daily activities?: Yes Patient able to express need for assistance with ADLs?: Yes Independently performs ADLs?: Yes       Abuse/Neglect Assessment (Assessment to be complete  while patient is alone) Physical Abuse: Denies Verbal Abuse: Denies Sexual Abuse: Denies Values / Beliefs Cultural Requests During Hospitalization: None Spiritual Requests During Hospitalization: None        Additional Information 1:1 In Past 12 Months?: No CIRT Risk: No Elopement Risk: No Does patient have medical clearance?: Yes     Disposition: [facility] placement.  Disposition Disposition of Patient: Referred to Patient referred to:  [facility])  On Site Evaluation by:   Reviewed with Physician:     Phillip Heal LaVerne 07/19/2012 1:30 AM

## 2012-07-19 NOTE — ED Notes (Signed)
Pt very tearful and feels guilty about attempting suicide. She also said she's very sad and misses her family very much. She had some general questions about [facility] and asked to see the CSW as well.

## 2012-07-19 NOTE — Progress Notes (Signed)
[facility] MD Progress Note  07/19/2012 11:51 PM  Diagnosis:   Axis I: Anxiety Disorder NOS, Major Depression, Recurrent severe and Benzodiazepine causing advere reaction in therapeutic use Axis II: Deferred Axis III:  Past Medical History  Diagnosis Date  . Anxiety and depression   . Abnormal Pap smear of cervix 10/2007    colposcopy 12/2007  . Post partum depression   . HTN complicating peripregnancy, antepartum    Axis IV: other psychosocial or environmental problems, problems related to social environment and problems with primary support group Axis V: 31-40 impairment in reality testing  ADL's:  Intact  Sleep: Poor  Appetite:  Fair  Suicidal Ideation:  Plan:  No Intent:  No Means:  No Homicidal Ideation:  Plan:  No Intent:  No Means:  No  AEB (as evidenced by):  Mental Status Examination/Evaluation: Objective:  Appearance: Casual  Eye Contact::  Good  Speech:  Clear and Coherent  Volume:  Normal  Mood:  Anxious, Depressed, Hopeless, Irritable and Worthless  Affect:  Congruent and Tearful  Thought Process:  Coherent  Orientation:  Full  Thought Content:  WDL  Suicidal Thoughts:  Yes.  without intent/plan  Homicidal Thoughts:  No  Memory:  Immediate;   Fair Recent;   Fair Remote;   Fair  Judgement:  Impaired  Insight:  Lacking  Psychomotor Activity:  Normal  Concentration:  Good  Recall:  Fair  Akathisia:  No  Handed:  Right  AIMS (if indicated):     Assets:  Communication Skills Desire for Improvement  Sleep:      Vital Signs:Blood pressure 139/97, pulse 74, temperature 98.2 F (36.8 C), temperature source Oral, resp. rate 16, height 4' 11.25" (1.505 m), weight 70.761 kg (156 lb), last menstrual period 06/23/2012, SpO2 100.00%. Current Medications: Current Facility-Administered Medications  Medication Dose Route Frequency Provider Last Rate Last Dose  . alum & mag hydroxide-simeth (MAALOX/MYLANTA) 200-200-20 MG/5ML suspension 30 mL  30 mL Oral Q4H PRN  Mike Craze, MD      . chlorproMAZINE (THORAZINE) tablet 25 mg  25 mg Oral TID Mike Craze, MD   25 mg at 07/19/12 2143  . gabapentin (NEURONTIN) capsule 100 mg  100 mg Oral TID Mike Craze, MD      . hydrOXYzine (ATARAX/VISTARIL) tablet 25 mg  25 mg Oral TID PRN Mike Craze, MD      . hydrOXYzine (ATARAX/VISTARIL) tablet 50 mg  50 mg Oral QHS,MR X 1 Mike Craze, MD   50 mg at 07/19/12 2143  . ibuprofen (ADVIL,MOTRIN) tablet 400 mg  400 mg Oral Q6H PRN Mike Craze, MD   400 mg at 07/19/12 1155  . lisinopril (PRINIVIL,ZESTRIL) tablet 5 mg  5 mg Oral Daily Mike Craze, MD   5 mg at 07/19/12 1153  . magnesium hydroxide (MILK OF MAGNESIA) suspension 30 mL  30 mL Oral Daily PRN Mike Craze, MD      . norethindrone (MICRONOR,CAMILA,ERRIN) 0.35 MG tablet 0.35 mg  1 tablet Oral Daily Mike Craze, MD      . norethindrone-ethinyl estradiol Brooks Sailors) tablet 1 tablet  1 tablet Oral Daily Larena Sox, MD   1 tablet at 07/19/12 2200  . sertraline (ZOLOFT) tablet 100 mg  100 mg Oral Daily Mike Craze, MD   100 mg at 07/19/12 1154  . DISCONTD: acetaminophen (TYLENOL) tablet 650 mg  650 mg Oral Q6H PRN Mike Craze, MD      .  DISCONTD: norethindrone-ethinyl estradiol (OVCON-35,BALZIVA,BRIELLYN) tablet 1 tablet  1 tablet Oral Daily Larena Sox, MD      . DISCONTD: sertraline (ZOLOFT) tablet 100 mg  100 mg Oral Daily Mike Craze, MD       Facility-Administered Medications Ordered in Other Encounters  Medication Dose Route Frequency Provider Last Rate Last Dose  . DISCONTD: ondansetron (ZOFRAN) tablet 4 mg  4 mg Oral Q8H PRN Celene Kras, MD        Lab Results:  Results for orders placed during the hospital encounter of 07/18/12 (from the past 48 hour(s))  GLUCOSE, CAPILLARY     Status: Normal   Collection Time   07/18/12  2:35 PM      Component Value Range Comment   Glucose-Capillary 88  70 - 99 mg/dL    Comment 1 Documented in Chart      Comment  2 Notify RN     URINE RAPID DRUG SCREEN (HOSP PERFORMED)     Status: Abnormal   Collection Time   07/18/12  3:01 PM      Component Value Range Comment   Opiates NONE DETECTED  NONE DETECTED    Cocaine NONE DETECTED  NONE DETECTED    Benzodiazepines POSITIVE (*) NONE DETECTED    Amphetamines NONE DETECTED  NONE DETECTED    Tetrahydrocannabinol POSITIVE (*) NONE DETECTED    Barbiturates NONE DETECTED  NONE DETECTED   CBC     Status: Normal   Collection Time   07/18/12  3:15 PM      Component Value Range Comment   WBC 5.5  4.0 - 10.5 K/uL    RBC 4.15  3.87 - 5.11 MIL/uL    Hemoglobin 12.8  12.0 - 15.0 g/dL    HCT 16.1  09.6 - 04.5 %    MCV 90.4  78.0 - 100.0 fL    MCH 30.8  26.0 - 34.0 pg    MCHC 34.1  30.0 - 36.0 g/dL    RDW 40.9  81.1 - 91.4 %    Platelets 209  150 - 400 K/uL   COMPREHENSIVE METABOLIC PANEL     Status: Abnormal   Collection Time   07/18/12  3:15 PM      Component Value Range Comment   Sodium 137  135 - 145 mEq/L    Potassium 3.8  3.5 - 5.1 mEq/L    Chloride 104  96 - 112 mEq/L    CO2 24  19 - 32 mEq/L    Glucose, Bld 88  70 - 99 mg/dL    BUN 13  6 - 23 mg/dL    Creatinine, Ser 7.82  0.50 - 1.10 mg/dL    Calcium 8.7  8.4 - 95.6 mg/dL    Total Protein 6.8  6.0 - 8.3 g/dL    Albumin 3.8  3.5 - 5.2 g/dL    AST 13  0 - 37 U/L    ALT 11  0 - 35 U/L    Alkaline Phosphatase 36 (*) 39 - 117 U/L    Total Bilirubin 0.3  0.3 - 1.2 mg/dL    GFR calc non Af Amer >90  >90 mL/min    GFR calc Af Amer >90  >90 mL/min   ETHANOL     Status: Normal   Collection Time   07/18/12  3:15 PM      Component Value Range Comment   Alcohol, Ethyl (B) <11  0 - 11 mg/dL   ACETAMINOPHEN LEVEL  Status: Normal   Collection Time   07/18/12  3:15 PM      Component Value Range Comment   Acetaminophen (Tylenol), Serum <15.0  10 - 30 ug/mL   POCT PREGNANCY, URINE     Status: Normal   Collection Time   07/18/12  3:35 PM      Component Value Range Comment   Preg Test, Ur NEGATIVE   NEGATIVE   SALICYLATE LEVEL     Status: Abnormal   Collection Time   07/18/12  3:46 PM      Component Value Range Comment   Salicylate Lvl <2.0 (*) 2.8 - 20.0 mg/dL     Physical Findings: AIMS: Facial and Oral Movements Muscles of Facial Expression: None, normal Lips and Perioral Area: None, normal Jaw: None, normal Tongue: None, normal,Extremity Movements Upper (arms, wrists, hands, fingers): None, normal Lower (legs, knees, ankles, toes): None, normal, Trunk Movements Neck, shoulders, hips: None, normal, Overall Severity Severity of abnormal movements (highest score from questions above): None, normal Incapacitation due to abnormal movements: None, normal Patient's awareness of abnormal movements (rate only patient's report): No Awareness, Dental Status Current problems with teeth and/or dentures?: No Does patient usually wear dentures?: No  CIWA:  CIWA-Ar Total: 0  COWS:  COWS Total Score: 0   Treatment Plan Summary: Daily contact with patient to assess and evaluate symptoms and progress in treatment Medication management Mood/anxiety less than 3/10 where the scale is 1 is the best and 10 is the worst  Plan: Admit, start Thorazine for anxiety, Vistaril for anxiety and insomnia, continue Zoloft for depression, and Motrin for pain.  Also start Neurontin for detox/withdrawal seizure prevention.  Have pt attend 12 Step mtgs on [address].  Discussed the risks, benefits, and probable clinical course with and without treatment.  Pt is agreeable to the current course of treatment.  Sivan Cuello 07/19/2012, 11:51 PM

## 2012-07-19 NOTE — ED Notes (Signed)
Security contacted to transport pt across to [facility]

## 2012-07-19 NOTE — Progress Notes (Signed)
Patient seen to assess for discharge planning needs.  She reports admitting to the hospital following a suicide attempt.  Patient reports becoming overwhelmed due to caring for two small children ages three and two and learning husband will soon lose his job.  She advised husband left the home with the children and she took a handful of pills.  She also endorses smoking up to two bowls of THC daily and have two/three drinks of ETOH.  She is not interested in a residential program or CD-IOP.  Patient currently denies SI/HI.  She rates depression at seven, anxiety at four and helplessness/hopelessness at two. She has home and transportation but will need outpatient referral for counseling and medication management.

## 2012-07-19 NOTE — Progress Notes (Signed)
[facility] Group Notes: (Counselor/Nursing/MHT/Case Management/Adjunct) 07/19/2012   @1 :15 -2:30pm Preventing Relapse  Type of Therapy:  Group Therapy  Participation Level:  Active  Participation Quality:  Appropriate, Sharing   Affect:  Blunted    Cognitive:  Appropriate  Insight:  Good  Engagement in Group: Good  Engagement in Therapy:  Good  Modes of Intervention:  Support and Exploration  Summary of Progress/Problems: Kinzy came to group late after meeting with case manager but was immediately engaged. She explored ways that she has given power away in relationships, and related to how difficult it is to be empowered when one has low self-esteem. Gavriella discussed empowerment as taking control of one's decisions and believing in oneself. She examined the role that empowerment plays in relapse prevention by talking about empowering self to make own decisions about treatment and seeking help.  Angus Palms, LCSW 07/19/2012 3:20 PM

## 2012-07-19 NOTE — Progress Notes (Signed)
[facility] Group Notes:  (Counselor/Nursing/MHT/Case Management/Adjunct)  07/19/2012 1:13 PM  Type of Therapy:  Psychoeducational Skills  Participation Level:  Active  Participation Quality:  Appropriate  Affect:  Appropriate  Cognitive:  Alert and Appropriate  Insight:  Good  Engagement in Group:  Good  Engagement in Therapy:  Good  Modes of Intervention:  Education  Summary of Progress/Problems: Patient actively participated in the group. Patient showed great insight and shared in the group.   Ardelle Park O 07/19/2012, 1:13 PM

## 2012-07-19 NOTE — Progress Notes (Signed)
Patient ID: [name], female   DOB: [birth year], 36 y.o.   MRN: [id number] Staff reports patient wants to restart her OCP.  PLAN: Will restart norethindrone-ethinyl estradiol (OVCON-35,BALZIVA,BRIELLYN) tablet- 1 tablet daily.

## 2012-07-19 NOTE — Progress Notes (Signed)
Admission Note  [address]) This is a 36 year old WMM who took a "handful of pills to kill myself". Pt reports that she took Xanax, vicodin, and lisinopril. States that she has two beautiful children at home, ages 2 1/2 and 3 1/2. States that she has trouble taking care of them. Has had depression for two years and it has just gotten worse with the birth of her children. Struggles all day long to try and keep up with the activities of daily living. Pt also uses pot daily to help normalize her and help her cope. Tearful throughout the admission. A) Given support, reassurance and praise. Oriented to the unit. Seen by the doctor and orders received. Encouraged to go to groups and participate in the program. R) Pt states that she is not presently feeling like she wants to hurt herself, but states that if she were to leave she would not be able to maintain her safety.

## 2012-07-19 NOTE — [facility] Suicide Risk Assessment (Signed)
Suicide Risk Assessment  Admission Assessment     Demographic factors:  Assessment Details Time of Assessment: Admission Information Obtained From: Patient Current Mental Status:  Current Mental Status: Suicidal ideation indicated by patient;Suicide plan;Plan includes specific time, place, or method;Self-harm thoughts;Self-harm behaviors;Intention to act on suicide plan Loss Factors:  Loss Factors: Decline in physical health Historical Factors:  Historical Factors: Family history of mental illness or substance abuse;Impulsivity Risk Reduction Factors:  Risk Reduction Factors: Responsible for children under 82 years of age;Living with another person, especially a relative;Positive therapeutic relationship  CLINICAL FACTORS:   Severe Anxiety and/or Agitation Depression:   Hopelessness Alcohol/Substance Abuse/Dependencies Previous Psychiatric Diagnoses and Treatments  COGNITIVE FEATURES THAT CONTRIBUTE TO RISK:  Thought constriction (tunnel vision)    SUICIDE RISK:   Moderate:  Frequent suicidal ideation with limited intensity, and duration, some specificity in terms of plans, no associated intent, good self-control, limited dysphoria/symptomatology, some risk factors present, and identifiable protective factors, including available and accessible social support.  Reason for hospitalization: .Suicide attempt Diagnosis:   Axis I: Anxiety Disorder NOS, Major Depression, Recurrent severe and Benzodiazepine causing advere reaction in therapeutic use Axis II: Deferred Axis III:  Past Medical History Diagnosis Date . Anxiety and depression  . Abnormal Pap smear of cervix 10/2007   colposcopy 12/2007 . Post partum depression  . HTN complicating peripregnancy, antepartum   Axis IV: other psychosocial or environmental problems, problems related to social environment and problems with primary support group Axis V: 31-40 impairment in reality testing  ADL's:  Intact  Sleep:  Poor  Appetite:  Fair  Suicidal Ideation:  Plan:  No Intent:  No Means:  No Homicidal Ideation:  Plan:  No Intent:  No Means:  No  AEB (as evidenced by):  Mental Status Examination/Evaluation: Objective:  Appearance: Casual Eye Contact::  Good Speech:  Clear and Coherent Volume:  Normal Mood:  Anxious, Depressed, Hopeless, Irritable and Worthless Affect:  Congruent and Tearful Thought Process:  Coherent Orientation:  Full Thought Content:  WDL Suicidal Thoughts:  Yes.  without intent/plan Homicidal Thoughts:  No Memory:  Immediate;   Fair Recent;   Fair Remote;   Fair Judgement:  Impaired Insight:  Lacking Psychomotor Activity:  Normal Concentration:  Good Recall:  Fair Akathisia:  No Handed:  Right AIMS (if indicated):    Assets:  Communication Skills Desire for Improvement Sleep:     Vital Signs:Blood pressure 139/97, pulse 74, temperature 98.2 F (36.8 C), temperature source Oral, resp. rate 16, height 4' 11.25" (1.505 m), weight 70.761 kg (156 lb), last menstrual period 06/23/2012, SpO2 100.00%. Current Medications: Current Facility-Administered Medications Medication Dose Route Frequency Provider Last Rate Last Dose . alum & mag hydroxide-simeth (MAALOX/MYLANTA) 200-200-20 MG/5ML suspension 30 mL  30 mL Oral Q4H PRN Mike Craze, MD     . chlorproMAZINE (THORAZINE) tablet 25 mg  25 mg Oral TID Mike Craze, MD   25 mg at 07/19/12 2143 . gabapentin (NEURONTIN) capsule 100 mg  100 mg Oral TID Mike Craze, MD     . hydrOXYzine (ATARAX/VISTARIL) tablet 25 mg  25 mg Oral TID PRN Mike Craze, MD     . hydrOXYzine (ATARAX/VISTARIL) tablet 50 mg  50 mg Oral QHS,MR X 1 Mike Craze, MD   50 mg at 07/19/12 2143 . ibuprofen (ADVIL,MOTRIN) tablet 400 mg  400 mg Oral Q6H PRN Mike Craze, MD   400 mg at 07/19/12 1155 . lisinopril (PRINIVIL,ZESTRIL) tablet 5 mg  5 mg  Oral Daily Mike Craze, MD   5 mg at 07/19/12 1153 . magnesium hydroxide (MILK OF  MAGNESIA) suspension 30 mL  30 mL Oral Daily PRN Mike Craze, MD     . norethindrone (MICRONOR,CAMILA,ERRIN) 0.35 MG tablet 0.35 mg  1 tablet Oral Daily Mike Craze, MD     . norethindrone-ethinyl estradiol Brooks Sailors) tablet 1 tablet  1 tablet Oral Daily Larena Sox, MD   1 tablet at 07/19/12 2200 . sertraline (ZOLOFT) tablet 100 mg  100 mg Oral Daily Mike Craze, MD   100 mg at 07/19/12 1154 . DISCONTD: acetaminophen (TYLENOL) tablet 650 mg  650 mg Oral Q6H PRN Mike Craze, MD     . DISCONTD: norethindrone-ethinyl estradiol (OVCON-35,BALZIVA,BRIELLYN) tablet 1 tablet  1 tablet Oral Daily Larena Sox, MD     . DISCONTD: sertraline (ZOLOFT) tablet 100 mg  100 mg Oral Daily Mike Craze, MD      Facility-Administered Medications Ordered in Other Encounters Medication Dose Route Frequency Provider Last Rate Last Dose . DISCONTD: ondansetron (ZOFRAN) tablet 4 mg  4 mg Oral Q8H PRN Celene Kras, MD       Lab Results:  Results for orders placed during the hospital encounter of 07/18/12 (from the past 48 hour(s)) GLUCOSE, CAPILLARY     Status: Normal  Collection Time  07/18/12  2:35 PM     Component Value Range Comment  Glucose-Capillary 88  70 - 99 mg/dL   Comment 1 Documented in Chart     Comment 2 Notify RN    URINE RAPID DRUG SCREEN (HOSP PERFORMED)     Status: Abnormal  Collection Time  07/18/12  3:01 PM     Component Value Range Comment  Opiates NONE DETECTED  NONE DETECTED   Cocaine NONE DETECTED  NONE DETECTED   Benzodiazepines POSITIVE (*) NONE DETECTED   Amphetamines NONE DETECTED  NONE DETECTED   Tetrahydrocannabinol POSITIVE (*) NONE DETECTED   Barbiturates NONE DETECTED  NONE DETECTED  CBC     Status: Normal  Collection Time  07/18/12  3:15 PM     Component Value Range Comment  WBC 5.5  4.0 - 10.5 K/uL   RBC 4.15  3.87 - 5.11 MIL/uL   Hemoglobin 12.8  12.0 - 15.0 g/dL   HCT 16.1  09.6 - 04.5 %   MCV 90.4  78.0 - 100.0 fL   MCH 30.8   26.0 - 34.0 pg   MCHC 34.1  30.0 - 36.0 g/dL   RDW 40.9  81.1 - 91.4 %   Platelets 209  150 - 400 K/uL  COMPREHENSIVE METABOLIC PANEL     Status: Abnormal  Collection Time  07/18/12  3:15 PM     Component Value Range Comment  Sodium 137  135 - 145 mEq/L   Potassium 3.8  3.5 - 5.1 mEq/L   Chloride 104  96 - 112 mEq/L   CO2 24  19 - 32 mEq/L   Glucose, Bld 88  70 - 99 mg/dL   BUN 13  6 - 23 mg/dL   Creatinine, Ser 7.82  0.50 - 1.10 mg/dL   Calcium 8.7  8.4 - 95.6 mg/dL   Total Protein 6.8  6.0 - 8.3 g/dL   Albumin 3.8  3.5 - 5.2 g/dL   AST 13  0 - 37 U/L   ALT 11  0 - 35 U/L   Alkaline Phosphatase 36 (*) 39 - 117 U/L   Total Bilirubin  0.3  0.3 - 1.2 mg/dL   GFR calc non Af Amer >90  >90 mL/min   GFR calc Af Amer >90  >90 mL/min  ETHANOL     Status: Normal  Collection Time  07/18/12  3:15 PM     Component Value Range Comment  Alcohol, Ethyl (B) <11  0 - 11 mg/dL  ACETAMINOPHEN LEVEL     Status: Normal  Collection Time  07/18/12  3:15 PM     Component Value Range Comment  Acetaminophen (Tylenol), Serum <15.0  10 - 30 ug/mL  POCT PREGNANCY, URINE     Status: Normal  Collection Time  07/18/12  3:35 PM     Component Value Range Comment  Preg Test, Ur NEGATIVE  NEGATIVE  SALICYLATE LEVEL     Status: Abnormal  Collection Time  07/18/12  3:46 PM     Component Value Range Comment  Salicylate Lvl <2.0 (*) 2.8 - 20.0 mg/dL    Physical Findings: AIMS: Facial and Oral Movements Muscles of Facial Expression: None, normal Lips and Perioral Area: None, normal Jaw: None, normal Tongue: None, normal,Extremity Movements Upper (arms, wrists, hands, fingers): None, normal Lower (legs, knees, ankles, toes): None, normal, Trunk Movements Neck, shoulders, hips: None, normal, Overall Severity Severity of abnormal movements (highest score from questions above): None, normal Incapacitation due to abnormal movements: None, normal Patient's awareness of abnormal movements (rate only patient's  report): No Awareness, Dental Status Current problems with teeth and/or dentures?: No Does patient usually wear dentures?: No  CIWA:  CIWA-Ar Total: 0  COWS:  COWS Total Score: 0   Risk: Risk of harm to self is elevated by her anxiety, depression, and addictions  Risk of harm to others is minimal in that she has not been involved in fights or had any legal charges filed on her.  Treatment Plan Summary: Daily contact with patient to assess and evaluate symptoms and progress in treatment Medication management Mood/anxiety less than 3/10 where the scale is 1 is the best and 10 is the worst  Plan: Admit, start Thorazine for anxiety, Vistaril for anxiety and insomnia, continue Zoloft for depression, and Motrin for pain.  Also start Neurontin for detox/withdrawal seizure prevention.  Have pt attend 12 Step mtgs on [address].  Discussed the risks, benefits, and probable clinical course with and without treatment.  Pt is agreeable to the current course of treatment. We will continue on q. 15 checks the unit protocol. At this time there is no clinical indication for one-to-one observation as patient contract for safety and presents little risk to harm themself and others.  We will increase collateral information. I encourage patient to participate in group milieu therapy. Pt will be seen in treatment team soon for further treatment and appropriate discharge planning. Please see history and physical note for more detailed information ELOS: 3 to 5 days.   [name] 07/19/2012, 11:53 PM

## 2012-07-19 NOTE — Progress Notes (Signed)
D: Pt observed interacting appropriately within the milieu. Pt reporting an improving mood. Pt denies any SI/HI. A: Continued support and availability as needed was extended to this pt. Pt family member brought in her birth control this evening. Pt was restarted on her birth control this evening for period regulation.  R: Pt remains safe at this time with q65min checks.

## 2012-07-19 NOTE — Progress Notes (Signed)
07/19/2012         Time: 1500      Group Topic/Focus: The focus of the group is on enhancing the patients' ability to utilize positive relaxation strategies by practicing several that can be used at discharge.  Participation Level: Did not attend  Participation Quality: Not Applicable  Affect: Not Applicable  Cognitive: Not Applicable   Additional Comments: Patient didn't attend group.   Haydan Mansouri 07/19/2012 3:33 PM  

## 2012-07-19 NOTE — [facility] Counselor (Signed)
Adult Comprehensive Assessment  Patient ID: [name], female   DOB: [birth year], 36 y.o.   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; likes it but misses adult company Family Relationships: arguments with husband over Acupuncturist / Lack of resources (include bankruptcy): husband's job ends Aug 23  Housing / Lack of housing: worried what will happen after husband is laid off Physical health (include injuries & life threatening diseases): Pre-Menstrual Dysphoric Disorder Social relationships: very few friends, and does not get to see the ones she has Substance abuse: marijuana Bereavement / Loss: loss of identity and time for self  Living/Environment/Situation:  Living Arrangements: Spouse/significant other;Children Living conditions (as described by patient or guardian): lives with family How long has patient lived in current situation?: 3 years What is atmosphere in current home: Supportive  Family History:  Marital status: Married Number of Years Married: 6  What types of issues is patient dealing with in the relationship?: stress over finances; husband will be laid off in August Does patient have children?: Yes How many children?: 2  How is patient's relationship with their children?: 2.5 and 3.5 years   Childhood History:  By whom was/is the patient raised?: Mother/father and step-parent Additional childhood history information: parents and grandparents all live in the area; stepfather has been around most of her life, bio father never in the picture Description of patient's relationship with caregiver when they were a child: good with whole family Patient's description of current relationship with people who raised him/her: great with both Does patient have siblings?: Yes Number of  Siblings: 2  Description of patient's current relationship with siblings: step-sisters who live far away, not close at all Did patient suffer any verbal/emotional/physical/sexual abuse as a child?: No Did patient suffer from severe childhood neglect?: No Has patient ever been sexually abused/assaulted/raped as an adolescent or adult?: No Was the patient ever a victim of a crime or a disaster?: No Witnessed domestic violence?: No Has patient been effected by domestic violence as an adult?: No  Education:  Highest grade of school patient has completed: some grad school Currently a Consulting civil engineer?: No Learning disability?: No  Employment/Work Situation:   Employment situation: 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) Patient's job has been impacted by current illness: No What is the longest time patient has a held a job?:  a year or so 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, yoga classes, outdoor classes Has patient ever been in the [employer]?: No Has patient ever served in combat?: No  Financial Resources:   Financial resources: Income from spouse Does patient have a representative payee or guardian?: No  Alcohol/Substance Abuse:   What has been your use of drugs/alcohol within the last 12 months?: smoked marijuana for 10 years but doesn't want to anymore - using a joint or so most days; used to drink a lot before having kids If attempted suicide, did drugs/alcohol play a role in this?: Yes (overdose on pills) Alcohol/Substance Abuse Treatment Hx: Denies past history If yes, describe treatment: n/a Has alcohol/substance abuse ever caused legal problems?: No  Social Support System:   Describe Community Support System: husband, parents, in-laws Type of faith/religion: none How does patient's faith help to cope with current illness?: thinking about going back to church for the community togetherness aspect  Leisure/Recreation:  Leisure and  Hobbies: yoga, working out at [employer], spending time with friends, reading  Strengths/Needs:   What things does the patient do well?: good teacher, intelligent, good mom, good wife, active In what areas does patient struggle / problems for patient: suicide attempt - feeling hopeless and that nothing will ever change; family and financial issues; PMDD, anxiety and depression since adolesence   Discharge Plan:   Does patient have access to transportation?: Yes Will patient be returning to same living situation after discharge?: Yes Currently receiving community mental health services: No If no, would patient like referral for services when discharged?: Yes (What county?) Medical sales representative) Does patient have financial barriers related to discharge medications?: No  Summary/Recommendations:   Summary and Recommendations (to be completed by the evaluator): [name] is a 36 year old married female diagnosed with Major Depressive Disorder. She reports that she has a good life - supportive family, loving husband, nice house and car, wonderful children - but feels "blah" and stressed out all of the time. Worried about the financial repercussions of husband being laid off in August. Does not have any social outlets and cannot find time for herself with two young children to care for. Reports being depressed since adolesence and recently diagnosed with PMDD, which plays a large role in her depressive episodes. After an argument with husband (during pre-menstrual cycle) she took a handful of pills thinking she should just die. No longer feels suicidal. [name] would benefit from crisis stabilization, medication evaluation, therapy groups for processing thoughts/feelings/experiences, psychoed groups for coping skills and case management for discharge planning.   [name], [name]. 07/19/2012

## 2012-07-20 DIAGNOSIS — F1210 Cannabis abuse, uncomplicated: Secondary | ICD-10-CM

## 2012-07-20 NOTE — Progress Notes (Signed)
Patient ID: [name], female   DOB: 07-03-1976, 36 y.o.   MRN: [id number]  [facility] Group Notes:  (Counselor/Nursing/MHT/Case Management/Adjunct)  07/20/2012 1:15 PM  Type of Therapy:  Group Therapy, Dance/Movement Therapy   Participation Level:  Active  Participation Quality:  Appropriate  Affect:  Appropriate  Cognitive:  Appropriate  Insight:  Limited  Engagement in Group:  Good  Engagement in Therapy:  Good  Modes of Intervention:  Clarification, Problem-solving, Role-play, Socialization and Support  Summary of Progress/Problems:  Therapist discussed the term 'self sabotage', asked group what is their definition of the term.  Group then discussed ways self sabotage occurs in their lives. Therapist passed out handout, The 7 Signs of Self-Sabotaging Behaviors.  Group members read each one aloud   and processed reasons for self sabotaging feels.  Group discussed how to recognize the self sabotaging though and developing positive coping mechanisms.  Pt. stated, "I need to stop focusing on my weight and accept things that I can not change".    [name] 07/20/2012. 4:08 PM

## 2012-07-20 NOTE — Progress Notes (Signed)
[facility] Group Notes:  (Counselor/Nursing/MHT/Case Management/Adjunct)  07/20/2012 1:25 AM  Type of Therapy:  Psychoeducational Skills  Participation Level:  Active  Participation Quality:  Appropriate  Affect:  Appropriate  Cognitive:  Appropriate  Insight:  Good  Engagement in Group:  Good  Engagement in Therapy:  Good  Modes of Intervention:  Education  Summary of Progress/Problems:The pt. Attended the A.A. Meeting on the [address] hallway.     Kipling Graser S 07/20/2012, 1:25 AM

## 2012-07-20 NOTE — Progress Notes (Signed)
[facility] Adult Inpatient Family/Significant Other Suicide Prevention Education  Suicide Prevention Education:  Education Completed; John [phone]-Pt.'s husband-has been identified by the patient as the family member/significant other with whom the patient will be residing, and identified as the person(s) who will aid the patient in the event of a mental health crisis (suicidal ideations/suicide attempt).  With written consent from the patient, the family member/significant other has been provided the following suicide prevention education, prior to the and/or following the discharge of the patient.  The suicide prevention education provided includes the following:  Suicide risk factors  Suicide prevention and interventions  National Suicide Hotline telephone number  [facility] assessment telephone number  [facility] Emergency Assistance 911  [facility] and/or Residential Mobile Crisis Unit telephone number  Request made of family/significant other to:  Remove weapons (e.g., guns, rifles, knives), all items previously/currently identified as safety concern.   Pt.'s husband states that the pt. Does not have access to guns or weapons  Remove drugs/medications (over-the-counter, prescriptions, illicit drugs), all items previously/currently identified as a safety concern. Pt.'s husband will remove , lock up and monitor the pt.'s medications. Pt,'s husband will secure home.  Pt.'s  Husband states that the pt.'s depression seems to be around her monthly period. He is unsure if the pt. Has shared this information with her OBGYN and that the pt. Has not seen the doctor since the birth of her children. Pt. Found his wife after she took an overdose of prescription medication.  Pt.'s husband is supportive and can be reached at the number above about pt.'s d/c plans.   The family member/significant other verbalizes understanding of the suicide prevention education information  provided.  The family member/significant other agrees to remove the items of safety concern listed above.  Lamar Blinks [place] 07/20/2012, 6:24 PM

## 2012-07-20 NOTE — Progress Notes (Signed)
D) Pt has attended the groups and interacts with her peers. Continues to work on her issues of how to take care of herself. Denies SI and HI Rating her depression at a 4 and her hopelessness at a 3. States that she feels safe in the hospital, but not outside. Is working on ways to help herself deal with the responsibility of two little children. States she feels that the medication is helping her. A). Pt Given support, reassurance and praise. Given much encouragement to make a plan, a schedule when she goes home as to how she will take care of herself and give herself "me time" without her children. Allowed Pt to vent her feelings and frustrations. R). Feels safe in the hospital, but not outside the hospital. Feels that she cannot contract for her safety if she leaves.

## 2012-07-20 NOTE — Progress Notes (Signed)
Pt attended AA this evening.

## 2012-07-20 NOTE — Progress Notes (Signed)
Psychoeducational Group Note  Date: 07/20/2012 Time:  1015  Group Topic/Focus:  Identifying Needs:   The focus of this group is to help patients identify their personal needs that have been historically problematic and identify healthy behaviors to address their needs.  Participation Level:  Active  Participation Quality:  Appropriate  Affect:  Appropriate  Cognitive:  Alert  Insight:  Good  Engagement in Group:  Good  Additional Comments:  Actively participated in the the group.  Dione Housekeeper

## 2012-07-20 NOTE — H&P (Signed)
Psychiatric Admission Assessment Adult  Patient Identification:  [name] Date of Evaluation:  07/20/2012 Chief Complaint:  MDD History of Present Illness:: Patient is a married 36 year old white female. Patient reports feelings of wanting to hurt herself due to severe depression. Patient reports that she has been thinking about hurting herself for a long time. Patient reports that she wrote a suicide note and took pills in order to kill herself. Patient reports that she does not remember what pills that she took or how many pills she took. Patient reports that she is not able to contract for safety. Patient reports that she receives medication for depression from her medical doctor. Patient reports that she does not have a therapist. Patient reports a history of depression since she was 36 years old. Patent reports that she was diagnosed with PMMD 3 years ago. Patient reports that she has been feelings helpless and overwhelmed with taking care of her two children that are 34 and 79 years old. Patient reports that she cannot do this anymore. Patient reports stress her marriage due to financial problems. Patient denied any psychosis. Patient denies any prior psychiatric  Mood Symptoms:  Anhedonia, Appetite, Concentration, Depression, Energy, Guilt, Helplessness, Hopelessness, Past 2 Weeks, Sadness, SI, Sleep, Worthlessness, Depression Symptoms:  depressed mood, anhedonia, insomnia, fatigue, feelings of worthlessness/guilt, hopelessness, suicidal attempt, anxiety, loss of energy/fatigue, (Hypo) Manic Symptoms:  none Anxiety Symptoms:  Excessive Worry, Panic Symptoms, Social Anxiety, Psychotic Symptoms:  none  PTSD Symptoms: none  Past Psychiatric History: Diagnosis: Anxiety D/O NOS, Major Depression, single episode,   Hospitalizations: none  Outpatient Care: PCP  Substance Abuse Care: attended 12 Step meeting once  Self-Mutilation: none  Suicidal Attempts: THIS TIME    Violent Behaviors: none   Past Medical History:   Past Medical History  Diagnosis Date  . Anxiety and depression   . Abnormal Pap smear of cervix 10/2007    colposcopy 12/2007  . Post partum depression   . HTN complicating peripregnancy, antepartum    None. Allergies:   Allergies  Allergen Reactions  . Amoxicillin Nausea Only   PTA Medications: Prescriptions prior to admission  Medication Sig Dispense Refill  . ALPRAZolam (XANAX) 0.25 MG tablet Take 0.25 mg by mouth at bedtime as needed. For stress,sleep      . buPROPion (WELLBUTRIN XL) 150 MG 24 hr tablet Take 150 mg by mouth daily.      Marland Kitchen ibuprofen (ADVIL,MOTRIN) 200 MG tablet Take 400 mg by mouth every 6 (six) hours as needed. For muscle aches      . lisinopril (PRINIVIL,ZESTRIL) 5 MG tablet Take 5 mg by mouth daily.      . norethindrone (MICRONOR,CAMILA,ERRIN) 0.35 MG tablet Take 1 tablet (0.35 mg total) by mouth daily.  1 Package  11  . sertraline (ZOLOFT) 100 MG tablet Take 100 mg by mouth daily.        Previous Psychotropic Medications:  Medication/Dose see above                 Substance Abuse History in the last 12 months: Substance Age of 1st Use Last Use Amount Specific Type  Nicotine      Alcohol      Cannabis  today    Opiates      Cocaine      Methamphetamines      LSD      Ecstasy      Benzodiazepines  Over dose attempt on this    Caffeine  Inhalants      Others:                         Consequences of Substance Abuse: Medical Consequences:  hurt liver Legal Consequences:  get DUI  Social History: Current Place of Residence:   Place of Birth:   Family Members: Marital Status:  Married Children:  Sons:  Daughters: Relationships: Education:   Educational Problems/Performance: Religious Beliefs/Practices: History of Abuse (Emotional/Phsycial/Sexual) Occupational Experiences; Military History:  none Legal History: Hobbies/Interests:  Family History:  History reviewed. No  pertinent family history.  Mental Status Examination/Evaluation: Objective:  Appearance: Casual  Eye Contact::  Good  Speech:  Clear and Coherent  Volume:  Normal  Mood:  Anxious, Depressed, Hopeless, Irritable and Worthless  Affect:  Congruent  Thought Process:  Coherent and Intact  Orientation:  Full  Thought Content:  WDL  Suicidal Thoughts:  Yes.  without intent/plan  Homicidal Thoughts:  No  Memory:  Immediate;   Fair Recent;   Fair Remote;   Fair  Judgement:  Impaired  Insight:  Lacking  Psychomotor Activity:  Normal  Concentration:  Fair  Recall:  Fair  Akathisia:  No  Handed:  Right  AIMS (if indicated):     Assets:  Communication Skills Desire for Improvement  Sleep:  Number of Hours: 5.5     Laboratory/X-Ray Psychological Evaluation(s)      Assessment:    AXIS I:  Anxiety Disorder NOS and Major Depression, single episode AXIS II:  Deferred AXIS III:   Past Medical History  Diagnosis Date  . Anxiety and depression   . Abnormal Pap smear of cervix 10/2007    colposcopy 12/2007  . Post partum depression   . HTN complicating peripregnancy, antepartum    AXIS IV:  economic problems, other psychosocial or environmental problems, problems related to social environment and problems with primary support group AXIS V:  31-40 impairment in reality testing  Treatment Plan/Recommendations:  Treatment Plan Summary: Daily contact with patient to assess and evaluate symptoms and progress in treatment Medication management Current Medications:  Current Facility-Administered Medications  Medication Dose Route Frequency Provider Last Rate Last Dose  . alum & mag hydroxide-simeth (MAALOX/MYLANTA) 200-200-20 MG/5ML suspension 30 mL  30 mL Oral Q4H PRN Mike Craze, MD      . chlorproMAZINE (THORAZINE) tablet 25 mg  25 mg Oral TID Mike Craze, MD   25 mg at 07/20/12 0803  . gabapentin (NEURONTIN) capsule 100 mg  100 mg Oral TID Mike Craze, MD   100 mg at  07/20/12 0803  . hydrOXYzine (ATARAX/VISTARIL) tablet 25 mg  25 mg Oral TID PRN Mike Craze, MD      . hydrOXYzine (ATARAX/VISTARIL) tablet 50 mg  50 mg Oral QHS,MR X 1 Mike Craze, MD   50 mg at 07/19/12 2143  . ibuprofen (ADVIL,MOTRIN) tablet 400 mg  400 mg Oral Q6H PRN Mike Craze, MD   400 mg at 07/20/12 0631  . lisinopril (PRINIVIL,ZESTRIL) tablet 5 mg  5 mg Oral Daily Mike Craze, MD   5 mg at 07/20/12 0803  . magnesium hydroxide (MILK OF MAGNESIA) suspension 30 mL  30 mL Oral Daily PRN Mike Craze, MD      . norethindrone (MICRONOR,CAMILA,ERRIN) 0.35 MG tablet 0.35 mg  1 tablet Oral Daily Mike Craze, MD   0.35 mg at 07/20/12 0807  . norethindrone-ethinyl estradiol (OVCON-35,BALZIVA,BRIELLYN) tablet 1 tablet  1  tablet Oral Daily Larena Sox, MD   1 tablet at 07/19/12 2200  . sertraline (ZOLOFT) tablet 100 mg  100 mg Oral Daily Mike Craze, MD   100 mg at 07/19/12 1154  . DISCONTD: acetaminophen (TYLENOL) tablet 650 mg  650 mg Oral Q6H PRN Mike Craze, MD      . DISCONTD: norethindrone-ethinyl estradiol (OVCON-35,BALZIVA,BRIELLYN) tablet 1 tablet  1 tablet Oral Daily Larena Sox, MD      . DISCONTD: sertraline (ZOLOFT) tablet 100 mg  100 mg Oral Daily Mike Craze, MD        Observation Level/Precautions:  q 15  Laboratory:  routine  Psychotherapy:    Medications:    Routine PRN Medications:  Yes  Consultations:    Discharge Concerns:    OtherDan Humphreys, Shaneka Efaw 7/27/201310:43 AM

## 2012-07-20 NOTE — Progress Notes (Signed)
  [name] is a 36 y.o. female [id number] [birth year]  07/19/2012 Active Problems:  Cannabis abuse   Mental Status:  Mood is bright and cheerful denies SI/HI/AVH.  Subjective/Objective: Meds are helping. Notices that her thoughts are clearer and she feels more hopeful.    Filed Vitals:   07/20/12 0713  BP: 113/87  Pulse: 99  Temp:   Resp:     Lab Results:   BMET    Component Value Date/Time   NA 137 07/18/2012 1515   K 3.8 07/18/2012 1515   CL 104 07/18/2012 1515   CO2 24 07/18/2012 1515   GLUCOSE 88 07/18/2012 1515   BUN 13 07/18/2012 1515   CREATININE 0.72 07/18/2012 1515   CALCIUM 8.7 07/18/2012 1515   GFRNONAA >90 07/18/2012 1515   GFRAA >90 07/18/2012 1515    Medications:  Scheduled:     . chlorproMAZINE  25 mg Oral TID  . gabapentin  100 mg Oral TID  . hydrOXYzine  50 mg Oral QHS,MR X 1  . lisinopril  5 mg Oral Daily  . norethindrone  1 tablet Oral Daily  . norethindrone-ethinyl estradiol  1 tablet Oral Daily  . sertraline  100 mg Oral Daily  . DISCONTD: norethindrone-ethinyl estradiol  1 tablet Oral Daily     PRN Meds alum & mag hydroxide-simeth, hydrOXYzine, ibuprofen, magnesium hydroxide  Plan: continue curren tplan of care. Glendell Fouse,MICKIE D. 07/20/2012

## 2012-07-20 NOTE — Progress Notes (Signed)
[facility] Group Notes:  (Counselor/Nursing/MHT/Case Management/Adjunct)  07/20/2012 4:12 PM  Type of Therapy:  After care Planning group  Pt. participated in after care planning group  And was given [place] Suicide Prevention information and crisis numbers and pt. Agreed  to use numbers and information  if needed. Pt. stated that She was feeling okay. Pt. Stated she came to [facility] due to a SI attempt. Pt. denied SI/HI today.   [name] [place] 07/20/2012, 4:12 PM

## 2012-07-20 NOTE — Progress Notes (Signed)
[facility] Group Notes:  (Counselor/Nursing/MHT/Case Management/Adjunct)  07/20/2012 6:27 PM  Type of Therapy:  Psychoeducational Skills  Participation Level:  Active  Participation Quality:  Appropriate  Affect:  Appropriate  Cognitive:  Appropriate  Insight:  Good  Engagement in Group:  Good  Engagement in Therapy:  Good  Modes of Intervention:  Clarification, Education, Problem-solving, Socialization and Support  Summary of Progress/Problems:Pt participated in group in which personal needs were clarified and identified. Pt took part in group discussion on the stressors of being in a hospital and having many things out of your control. Pt and staff discussed coping skills and techniques to manage stress and anxiety symptoms. Rumination, PTSD symptoms, and opening up were important topics to the group. Pt was able to identify two positive things about herself and something good that has happened today.     Anselm Pancoast 07/20/2012, 6:27 PM

## 2012-07-21 NOTE — Progress Notes (Signed)
D) Verbalizes fear about going home. States she feels good in the hospital, but is concerned when she leaves here. States that she believes the medication is working well and she feels less anxious. Rates her depression and her hopelessness both at a 1 and denies SI and HI. A) given support, reassurance and praise. Given encouragement. Went over a schedule with Pt when she gets home and what she has to do for herself daily.  R) Pt states taht she is going to get up every morning and exercise. Knows that this helps her.

## 2012-07-21 NOTE — Progress Notes (Signed)
Psychoeducational Group Note  Date:  07/21/2012 Time:  1015  Group Topic/Focus:  Building Self Esteem:   The Focus of this group is helping patients become aware of the effects of self-esteem on their lives, the things they and others do that enhance or undermine their self-esteem, seeing the relationship between their level of self-esteem and the choices they make and learning ways to enhance self-esteem.  Participation Level:  Active  Participation Quality:  Appropriate and Attentive  Affect:  Appropriate  Cognitive:  Alert and Appropriate  Insight:  Good  Engagement in Group:  Good  Additional Comments:  Positive feedback for shares.  Cresenciano Lick 07/21/2012, 4:40 PM

## 2012-07-21 NOTE — Progress Notes (Signed)
Patient ID: [name], female   DOB: [birth year], 36 y.o.   MRN: [id number] D)  Has been out and interacting with peers, laughing at times, playing cards at times, bright, and smiling on approach.  Stated feeling a little better this evening attended group on [address] and felt the group had been good. States is working on her issues and feels her meds are helping.  A) Will continue to try to develop rapport, continue to monitor for safety, continue POC R) Appreciative, receptive.

## 2012-07-21 NOTE — Progress Notes (Signed)
Psychoeducational Group Note  Date:  07/21/2012 Time:  0930  Group Topic/Focus:  Spirituality:   The focus of this group is to discuss how one's spirituality can aide in recovery.  Participation Level:  Did Not Attend  Participation Quality:  did not attend  Affect:  Flat  Cognitive:  Oriented  Insight:  did not attend  Engagement in Group:  did not attend  Additional Comments:  Pt did not attend morning spirituality group. This group focused on spirituality as an aspect of wellness.  Orma Render 07/21/2012, 11:08 AM

## 2012-07-21 NOTE — Progress Notes (Signed)
[facility] Group Notes:  (Counselor/Nursing/MHT/Case Management/Adjunct)  07/21/2012 12:22 PM  Type of Therapy:  After Care Planning  Pt. participated in after care planning group and was given [facility] Suicide prevention information and crisis hot line numbers. Pt. agreed to use if needed. Pt. Was also given workbook on topic for today( healthy support systems). Pt. Was also given information on the [facility] and a list of support groups. Pt. Stated she was fine today and states she slept good today. Pt. Saw doctor but had no medication changes. Pt. denied SI/HI today.  [name] 07/21/2012, 12:22 PM

## 2012-07-21 NOTE — Progress Notes (Signed)
  [name] is a 36 y.o. female [id number] 1976/08/22  07/19/2012 Active Problems:  Cannabis abuse   Mental Status: Mood is good denies SI/HI/AVH    Subjective/Objective: Active in [place]. Meds are helping.    Filed Vitals:   07/21/12 0701  BP: 140/87  Pulse: 116  Temp: 98.7 F (37.1 C)  Resp: 16    Lab Results:   BMET    Component Value Date/Time   NA 137 07/18/2012 1515   K 3.8 07/18/2012 1515   CL 104 07/18/2012 1515   CO2 24 07/18/2012 1515   GLUCOSE 88 07/18/2012 1515   BUN 13 07/18/2012 1515   CREATININE 0.72 07/18/2012 1515   CALCIUM 8.7 07/18/2012 1515   GFRNONAA >90 07/18/2012 1515   GFRAA >90 07/18/2012 1515    Medications:  Scheduled:     . chlorproMAZINE  25 mg Oral TID  . gabapentin  100 mg Oral TID  . hydrOXYzine  50 mg Oral QHS,MR X 1  . lisinopril  5 mg Oral Daily  . norethindrone  1 tablet Oral Daily  . norethindrone-ethinyl estradiol  1 tablet Oral Daily  . sertraline  100 mg Oral Daily     PRN Meds alum & mag hydroxide-simeth, hydrOXYzine, ibuprofen, magnesium hydroxide  Plan: Continue current plan of care. Mylo Choi,MICKIE D. 07/21/2012

## 2012-07-21 NOTE — Progress Notes (Signed)
[facility] Group Notes:  (Counselor/Nursing/MHT/Case Management/Adjunct)  07/21/2012 6:53 PM  ype of Therapy:  Group Therapy  Participation Level:  Active  Participation Quality:  Appropriate and Attentive  Affect:  Appropriate  Cognitive:  Appropriate  Insight:  Good  Engagement in Group:  Good  Engagement in Therapy:  Good  Modes of Intervention:  Clarification, Education, Socialization and Support  Summary of Progress/Problems: Pt. participated in group on health support systems and what support means to the. Each pt. Was encouraged to find health supports and to reach out if they did not have many supports. Each members was encouraged to attend support groups and each pt. participated in a support activity at the end of group.  The pt. Spoke about her family being a support for her.   Lamar Blinks [place] 07/21/2012, 6:53 PM

## 2012-07-21 NOTE — Progress Notes (Signed)
Pt attended the speaker AA meeting and was attentive and appropriate.

## 2012-07-21 NOTE — Progress Notes (Signed)
Psychoeducational Group Note  Date:  07/21/2012 Time:  1515  Group Topic/Focus:  Conflict Resolution:   The focus of this group is to discuss the conflict resolution process and how it may be used upon discharge.  Participation Level:  Active  Participation Quality:  Appropriate, Sharing and Supportive  Affect:  Appropriate  Cognitive:  Appropriate  Insight:  Good  Engagement in Group:  Good  Additional Comments:  none   Carmack M 07/21/2012, 4:54 PM

## 2012-07-22 MED ORDER — LISINOPRIL 5 MG PO TABS: 5.0000 mg | ORAL_TABLET | Freq: Every day | ORAL | Status: DC

## 2012-07-22 MED ORDER — SERTRALINE HCL 100 MG PO TABS: 100.0000 mg | ORAL_TABLET | Freq: Every day | ORAL | Status: DC

## 2012-07-22 MED ORDER — CHLORPROMAZINE HCL 25 MG PO TABS: 25.0000 mg | ORAL_TABLET | Freq: Three times a day (TID) | ORAL | Status: DC

## 2012-07-22 MED ORDER — HYDROXYZINE HCL 50 MG PO TABS: 50.0000 mg | ORAL_TABLET | Freq: Every evening | ORAL | Status: AC | PRN

## 2012-07-22 MED ORDER — GABAPENTIN 100 MG PO CAPS: 100.0000 mg | ORAL_CAPSULE | Freq: Three times a day (TID) | ORAL | Status: DC

## 2012-07-22 MED ORDER — NORETHINDRONE 0.35 MG PO TABS: 1.0000 | ORAL_TABLET | Freq: Every day | ORAL | Status: DC

## 2012-07-22 NOTE — Progress Notes (Signed)
Discharge Note:   Patient discharged home with family member.  Patient denied SI and HI.   Denied A/V hallucinations.   Denied pain.   Patient received her meds for discharge.   Discharged with her clothes which were in her room.  Patient did not have any items in a locker.  Patient stated she appreciated everything the staff has done to assist her while at [facility].  Patient was cooperative and pleasant.

## 2012-07-22 NOTE — Progress Notes (Signed)
Patient ID: [name], female   DOB: [birth year], 36 y.o.   MRN: [id number] Has been rather quiet and guarded this evening, stated she was tired.  Didn't go to group, was sleeping, came out to med window for hs meds,, and went back to bed. Interaction was minimal, didn't seem to want to engage this evening. A) Will continue to monitor, continue POC, support .

## 2012-07-22 NOTE — Progress Notes (Signed)
Date: 07/22/2012        Time: 1145       Group Topic/Focus: Patient invited to participate in animal assisted therapy. Pets as a coping skill and responsibility were discussed.  Participation Level: Active  Participation Quality: Appropriate and Attentive  Affect: Appropriate  Cognitive: Appropriate and Oriented   Additional Comments: Patient reports she is very interested in pet therapy, asked for more information. RT talked with patient about getting her dog certified and finding somewhere to visit as a healthy activity for her to do upon discharge.  Rasheida Broden 07/22/2012 12:16 PM

## 2012-07-22 NOTE — Progress Notes (Signed)
[facility] MD Progress Note  07/22/2012 2:17 PM  Diagnosis:   Axis I: Anxiety Disorder NOS, Major Depression, Recurrent severe, Substance Abuse and Benzodiazepine causing advere reaction in therapeutic use Axis II: Deferred Axis III:  Past Medical History  Diagnosis Date  . Anxiety and depression   . Abnormal Pap smear of cervix 10/2007    colposcopy 12/2007  . Post partum depression   . HTN complicating peripregnancy, antepartum    Axis IV: other psychosocial or environmental problems, problems related to social environment and problems with primary support group Axis V: 61-70 mild symptoms  ADL's:  Intact  Sleep: Good  Appetite:  Good  Suicidal Ideation:  Plan:  No Intent:  No Means:  No Homicidal Ideation:  Plan:  No Intent:  No Means:  No  AEB (as evidenced by):  Mental Status Examination/Evaluation: Objective:  Appearance: Casual  Eye Contact::  Good  Speech:  Clear and Coherent  Volume:  Normal  Mood:  Euthymic  Affect:  Congruent, Tearful and reportedly tear of joy, that she is alive.  Thought Process:  Coherent, Intact and Logical  Orientation:  Full  Thought Content:  WDL  Suicidal Thoughts:  No  Homicidal Thoughts:  No  Memory:  Immediate;   Good Recent;   Good Remote;   Good  Judgement:  Fair  Insight:  Lacking  Psychomotor Activity:  Normal  Concentration:  Good  Recall:  Good  Akathisia:  No  Handed:  Right  AIMS (if indicated):     Assets:  Communication Skills Desire for Improvement  Sleep:  Number of Hours: 6.75    Vital Signs:Blood pressure 112/68, pulse 106, temperature 98.2 F (36.8 C), temperature source Oral, resp. rate 18, height 4' 11.25" (1.505 m), weight 70.761 kg (156 lb), last menstrual period 06/23/2012, SpO2 100.00%. Current Medications: Current Facility-Administered Medications  Medication Dose Route Frequency Provider Last Rate Last Dose  . alum & mag hydroxide-simeth (MAALOX/MYLANTA) 200-200-20 MG/5ML suspension 30 mL  30 mL  Oral Q4H PRN Mike Craze, MD      . chlorproMAZINE (THORAZINE) tablet 25 mg  25 mg Oral TID Mike Craze, MD   25 mg at 07/22/12 1417  . gabapentin (NEURONTIN) capsule 100 mg  100 mg Oral TID Mike Craze, MD   100 mg at 07/22/12 1209  . hydrOXYzine (ATARAX/VISTARIL) tablet 25 mg  25 mg Oral TID PRN Mike Craze, MD      . hydrOXYzine (ATARAX/VISTARIL) tablet 50 mg  50 mg Oral QHS,MR X 1 Mike Craze, MD   50 mg at 07/21/12 2035  . ibuprofen (ADVIL,MOTRIN) tablet 400 mg  400 mg Oral Q6H PRN Mike Craze, MD   400 mg at 07/20/12 0631  . lisinopril (PRINIVIL,ZESTRIL) tablet 5 mg  5 mg Oral Daily Mike Craze, MD   5 mg at 07/22/12 0815  . magnesium hydroxide (MILK OF MAGNESIA) suspension 30 mL  30 mL Oral Daily PRN Mike Craze, MD      . norethindrone (MICRONOR,CAMILA,ERRIN) 0.35 MG tablet 0.35 mg  1 tablet Oral Daily Mike Craze, MD   0.35 mg at 07/22/12 0800  . sertraline (ZOLOFT) tablet 100 mg  100 mg Oral Daily Mike Craze, MD   100 mg at 07/22/12 1208  . DISCONTD: norethindrone-ethinyl estradiol (OVCON-35,BALZIVA,BRIELLYN) tablet 1 tablet  1 tablet Oral Daily Larena Sox, MD   1 tablet at 07/19/12 2200    Lab Results:  No results found for this  or any previous visit (from the past 48 hour(s)).  Physical Findings: AIMS: Facial and Oral Movements Muscles of Facial Expression: None, normal Lips and Perioral Area: None, normal Jaw: None, normal Tongue: None, normal,Extremity Movements Upper (arms, wrists, hands, fingers): None, normal Lower (legs, knees, ankles, toes): None, normal, Trunk Movements Neck, shoulders, hips: None, normal, Overall Severity Severity of abnormal movements (highest score from questions above): None, normal Incapacitation due to abnormal movements: None, normal Patient's awareness of abnormal movements (rate only patient's report): No Awareness, Dental Status Current problems with teeth and/or dentures?: No Does patient usually wear  dentures?: No  CIWA:  CIWA-Ar Total: 0  COWS:  COWS Total Score: 0   Treatment Plan Summary: Daily contact with patient to assess and evaluate symptoms and progress in treatment Medication management Mood/anxiety less than 3/10 where the scale is 1 is the best and 10 is the worst  Plan: Pt has met inpatient goals and is ready for D/C.  Raahil Ong 07/22/2012, 2:17 PM

## 2012-07-22 NOTE — Progress Notes (Signed)
[facility] Group Notes: (Counselor/Nursing/MHT/Case Management/Adjunct) 07/22/2012   @  1:15-2:30pm Overcoming Obstacles to Wellness   Type of Therapy:  Group Therapy  Participation Level:  Active  Participation Quality:  Appropriate, Sharing  Affect:  Appropriate  Cognitive:  Appropriate  Insight:  Good  Engagement in Group: Good  Engagement in Therapy:  Good  Modes of Intervention:  Support and Exploration  Summary of Progress/Problems: [name] explored various dimensions of wellness. She processed her experience with spiritual obstacles, saying that she needs to get back into a church because the community aspect of church helps her remain stable. She stated that it gives her a sense of accountability. [name] shared that her feeling of connection to God gives her hope that life will get better, and that she can trust in herself even when she cannot trust in others. She talked about how her actions impact others, giving the example of how her husband was impacted when she would drink because of her mood the next day or the excuses he would make for her. [name] came to the conclusion that she is her own biggest obstacle, and was receptive to being intentional about making the choice to "do the hard thing" rather than just remain in her comfort zone.   Angus Palms, LCSW 07/22/2012  2:56 PM

## 2012-07-22 NOTE — [facility] Suicide Risk Assessment (Signed)
Suicide Risk Assessment  Discharge Assessment     Demographic factors:  Caucasian;Unemployed  (denies all)  Current Mental Status Per Nursing Assessment::   On Admission:  Suicidal ideation indicated by patient;Suicide plan;Plan includes specific time, place, or method;Self-harm thoughts;Self-harm behaviors;Intention to act on suicide plan At Discharge:   (denies all)  Loss Factors: Decline in physical health  Historical Factors: Family history of mental illness or substance abuse;Impulsivity  Risk Reduction Factors:   Responsible for children under 54 years of age;Sense of responsibility to family;Employed;Living with another person, especially a relative;Positive social support  Continued Clinical Symptoms:  Depression:   Insomnia  Cognitive Features That Contribute To Risk:  Thought constriction (tunnel vision)    Suicide Risk:  Minimal: No identifiable suicidal ideation.  Patients presenting with no risk factors but with morbid ruminations; may be classified as minimal risk based on the severity of the depressive symptoms  Discharge Diagnosis:   Axis I: Anxiety Disorder NOS, Major Depression, Recurrent severe, Substance Abuse and Benzodiazepine causing advere reaction in therapeutic use Axis II: Deferred Axis III:  Past Medical History  Diagnosis Date  . Anxiety and depression   . Abnormal Pap smear of cervix 10/2007    colposcopy 12/2007  . Post partum depression   . HTN complicating peripregnancy, antepartum    Axis IV: other psychosocial or environmental problems, problems related to social environment and problems with primary support group Axis V: 61-70 mild symptoms  Current Mental Status per Physician ADL's:  Intact  Sleep: Good  Appetite:  Good  Suicidal Ideation:  Plan:  No Intent:  No Means:  No Homicidal Ideation:  Plan:  No Intent:  No Means:  No  AEB (as evidenced by):  Mental Status Examination/Evaluation: Objective:  Appearance: Casual   Eye Contact::  Good  Speech:  Clear and Coherent  Volume:  Normal  Mood:  Euthymic  Affect:  Congruent, Tearful and reportedly tear of joy, that she is alive.  Thought Process:  Coherent, Intact and Logical  Orientation:  Full  Thought Content:  WDL  Suicidal Thoughts:  No  Homicidal Thoughts:  No  Memory:  Immediate;   Good Recent;   Good Remote;   Good  Judgement:  Fair  Insight:  Lacking  Psychomotor Activity:  Normal  Concentration:  Good  Recall:  Good  Akathisia:  No  Handed:  Right  AIMS (if indicated):     Assets:  Communication Skills Desire for Improvement  Sleep:  Number of Hours: 6.75    Vital Signs:Blood pressure 112/68, pulse 106, temperature 98.2 F (36.8 C), temperature source Oral, resp. rate 18, height 4' 11.25" (1.505 m), weight 70.761 kg (156 lb), last menstrual period 06/23/2012, SpO2 100.00%. Current Medications: Current Facility-Administered Medications  Medication Dose Route Frequency Provider Last Rate Last Dose  . alum & mag hydroxide-simeth (MAALOX/MYLANTA) 200-200-20 MG/5ML suspension 30 mL  30 mL Oral Q4H PRN Mike Craze, MD      . chlorproMAZINE (THORAZINE) tablet 25 mg  25 mg Oral TID Mike Craze, MD   25 mg at 07/22/12 1417  . gabapentin (NEURONTIN) capsule 100 mg  100 mg Oral TID Mike Craze, MD   100 mg at 07/22/12 1209  . hydrOXYzine (ATARAX/VISTARIL) tablet 25 mg  25 mg Oral TID PRN Mike Craze, MD      . hydrOXYzine (ATARAX/VISTARIL) tablet 50 mg  50 mg Oral QHS,MR X 1 Mike Craze, MD   50 mg at 07/21/12 2035  .  ibuprofen (ADVIL,MOTRIN) tablet 400 mg  400 mg Oral Q6H PRN Mike Craze, MD   400 mg at 07/20/12 0631  . lisinopril (PRINIVIL,ZESTRIL) tablet 5 mg  5 mg Oral Daily Mike Craze, MD   5 mg at 07/22/12 0815  . magnesium hydroxide (MILK OF MAGNESIA) suspension 30 mL  30 mL Oral Daily PRN Mike Craze, MD      . norethindrone (MICRONOR,CAMILA,ERRIN) 0.35 MG tablet 0.35 mg  1 tablet Oral Daily Mike Craze, MD    0.35 mg at 07/22/12 0800  . sertraline (ZOLOFT) tablet 100 mg  100 mg Oral Daily Mike Craze, MD   100 mg at 07/22/12 1208  . DISCONTD: norethindrone-ethinyl estradiol (OVCON-35,BALZIVA,BRIELLYN) tablet 1 tablet  1 tablet Oral Daily Larena Sox, MD   1 tablet at 07/19/12 2200    Lab Results:  No results found for this or any previous visit (from the past 72 hour(s)).  RISK REDUCTION FACTORS: What pt has learned from hospital stay is that she must keep up with her exercising and medications and that she only has control over herself.  Risk of self harm is elevated by her anxiety and depression, but she has herself to live for.  Risk of harm to others is minimal in that she has not been involved in fights or had any legal charges filed on her.  Pt seen in treatment team where she divulged the above information. The treatment team concluded that she was ready for discharge and had met her goals for an inpatient setting.  PLAN: Discharge home Continue Medication List  As of 07/22/2012  2:33 PM   STOP taking these medications         ALPRAZolam 0.25 MG tablet      buPROPion 150 MG 24 hr tablet         TAKE these medications      Indication    chlorproMAZINE 25 MG tablet   Commonly known as: THORAZINE   Take 1 tablet (25 mg total) by mouth 3 (three) times daily. For anxiety       gabapentin 100 MG capsule   Commonly known as: NEURONTIN   Take 1 capsule (100 mg total) by mouth 3 (three) times daily. For anxiety       hydrOXYzine 50 MG tablet   Commonly known as: ATARAX/VISTARIL   Take 1 tablet (50 mg total) by mouth at bedtime and may repeat dose one time if needed. For insomnia.       ibuprofen 200 MG tablet   Commonly known as: ADVIL,MOTRIN   Take 400 mg by mouth every 6 (six) hours as needed. For muscle aches       lisinopril 5 MG tablet   Commonly known as: PRINIVIL,ZESTRIL   Take 1 tablet (5 mg total) by mouth daily. For control of high blood pressure        norethindrone 0.35 MG tablet   Commonly known as: MICRONOR,CAMILA,ERRIN   Take 1 tablet (0.35 mg total) by mouth daily. For contraception       sertraline 100 MG tablet   Commonly known as: ZOLOFT   Take 1 tablet (100 mg total) by mouth daily. For depression.            Follow-up recommendations:  Activities: Resume typical activities Diet: Resume typical diet Other: Follow up with outpatient provider and report any side effects to out patient prescriber.  Plan: Pt has met inpatient goals and is ready for D/C.  Gadge Hermiz 07/22/2012, 2:32 PM

## 2012-07-22 NOTE — Discharge Planning (Signed)
Pt was seen this morning for discharge planning group.  Pt reported that she felt great.  She reported her depression level at a zero and her anxiety level at a zero also.  Pt stated that she is ready for discharge and she has learned ways to cope through the process groups. Upon discharge the pt will return home with her family. Pt did not have prior services upon admittance to the hospital, yet upon discharge the pt will follow up with Intensive Outpatient program located downstairs from the [facility] on this Wednesday at 8:45am. Pt can afford medication and she does have transportation home.  Pt denies SI/HI/AVH.

## 2012-07-22 NOTE — Progress Notes (Signed)
Psychoeducational Group Note  Date:  07/22/2012 Time:  2000  Group Topic/Focus:  Wrap-Up Group:   The focus of this group is to help patients review their daily goal of treatment and discuss progress on daily workbooks.  Participation Level: Did Not Attend  Participation Quality:  Not Applicable  Affect:  Not Applicable  Cognitive:  Not Applicable  Insight:  Not Applicable  Engagement in Group: Not Applicable  Additional Comments:  The pt. Did not attend group this evening.   Rashaan Wyles S 07/22/2012, 1:09 AM

## 2012-07-24 ENCOUNTER — Other Ambulatory Visit (HOSPITAL_COMMUNITY): Payer: BC Managed Care – PPO | Attending: Psychiatry

## 2012-07-24 NOTE — Progress Notes (Signed)
Patient Discharge Instructions:  After Visit Summary (AVS):   Access to EMR:  07/24/2012 Psychiatric Admission Assessment Note:   Access to EMR:  07/24/2012 Suicide Risk Assessment - Discharge Assessment:   Access to EMR:  07/24/2012 Next Level Care Provider Has Access to the EMR, 07/24/2012  Records provided to [facility] via [facility] access.  Wandra Scot, 07/24/2012, 3:59 PM

## 2012-07-25 ENCOUNTER — Other Ambulatory Visit (HOSPITAL_COMMUNITY): Payer: BC Managed Care – PPO

## 2012-07-26 ENCOUNTER — Other Ambulatory Visit (HOSPITAL_COMMUNITY): Payer: BC Managed Care – PPO

## 2012-07-26 NOTE — Discharge Summary (Signed)
Physician Discharge Summary Note  Patient:  [name] is an 36 y.o., female MRN:  [id number] DOB:  09-06-1976 Patient phone:  [phone] (home)  Patient address:   [address] [place] [address]   Date of Admission:  07/19/2012 Date of Discharge: 07/22/2012  Discharge Diagnoses: Active Problems:  Cannabis abuse  Axis Diagnosis:  Axis I: Anxiety Disorder NOS, Major Depression, Recurrent severe, Substance Abuse and Benzodiazepine causing advere reaction in therapeutic use  Axis II: Deferred  Axis III:  Past Medical History   Diagnosis  Date   .  Anxiety and depression    .  Abnormal Pap smear of cervix  10/2007     colposcopy 12/2007   .  Post partum depression    .  HTN complicating peripregnancy, antepartum    Axis IV: other psychosocial or environmental problems, problems related to social environment and problems with primary support group  Axis V: 61-70 mild symptoms  Level of Care:  IOP  Hospital Course:   Patient is a married 36 year old white female. Patient reports feelings of wanting to hurt herself due to severe depression. Patient reports that she has been thinking about hurting herself for a long time. Patient reports that she wrote a suicide note and took pills in order to kill herself. Patient reports that she does not remember what pills that she took or how many pills she took. Patient reports that she is not able to contract for safety. Patient reports that she receives medication for depression from her medical doctor. Patient reports that she does not have a therapist. Patient reports a history of depression since she was 36 years old. Patent reports that she was diagnosed with PMMD 3 years ago. Patient reports that she has been feelings helpless and overwhelmed with taking care of her two children that are 44 and 40 years old. Patient reports that she cannot do this anymore. Patient reports stress her marriage due to financial problems. Patient denied  any psychosis. Patient denies any prior psychiatric   While a patient in this hospital, Ms. Tackitt received medication management for anxiety and depression. They were ordered and received Thorazine, Neurontin, and Vistaril for anxiety and stayed on Zoloft for depression. They were also enrolled in group counseling sessions and activities in which they participated actively.   Patient attended treatment team meeting this am and met with treatment team members. Pt symptoms, treatment plan and response to treatment discussed. Ms. Ruggieri endorsed that their symptoms have improved. Pt also stated that they are stable for discharge.  They reported that from this hospital stay they had learned was that they only had control themselves and not others.  In other to maintain their sobriety, mood and anxiety, they will continue psychiatric care on outpatient basis. They will follow-up at [facility] on 7/31 at 0845.  In addition they were instructed to take all your medications as prescribed by your mental healthcare provider, to report any adverse effects and or reactions from your medicines to your outpatient provider promptly, patient is instructed and cautioned to not engage in alcohol and or illegal drug use while on prescription medicines, in the event of worsening symptoms, patient is instructed to call the crisis hotline, 911 and or go to the nearest ED for appropriate evaluation and treatment of symptoms.  They were instructed to attend 90 meeting in 90 days and that they should include [facility] in those 90 meetings.  Upon discharge, patient adamantly denies  suicidal, homicidal ideations, auditory, visual hallucinations and or delusional thinking. They left [facility] with all personal belongings via peraonal transportation in no apparent distress.  Consults:  None  Significant Diagnostic Studies:  labs: UDS positive for benzodiazepines and THC, while Salicylate and Acetaminophen levels, UPT,  BAL, CMET, and CBC w diff non contributory  Discharge Vitals:   Blood pressure 112/68, pulse 106, temperature 98.2 F (36.8 C), temperature source Oral, resp. rate 18, height 4' 11.25" (1.505 m), weight 70.761 kg (156 lb), last menstrual period 06/23/2012, SpO2 100.00%..  Mental Status Exam: See Mental Status Examination and Suicide Risk Assessment completed by Attending Physician prior to discharge.  Discharge destination:  Home  Is patient on multiple antipsychotic therapies at discharge:  No  Has Patient had three or more failed trials of antipsychotic monotherapy by history: N/A Recommended Plan for Multiple Antipsychotic Therapies: N/A  Medication List  As of 07/26/2012 12:24 AM   STOP taking these medications         ALPRAZolam 0.25 MG tablet      buPROPion 150 MG 24 hr tablet         TAKE these medications      Indication    chlorproMAZINE 25 MG tablet   Commonly known as: THORAZINE   Take 1 tablet (25 mg total) by mouth 3 (three) times daily. For anxiety       gabapentin 100 MG capsule   Commonly known as: NEURONTIN   Take 1 capsule (100 mg total) by mouth 3 (three) times daily. For anxiety       hydrOXYzine 50 MG tablet   Commonly known as: ATARAX/VISTARIL   Take 1 tablet (50 mg total) by mouth at bedtime and may repeat dose one time if needed. For insomnia.       ibuprofen 200 MG tablet   Commonly known as: ADVIL,MOTRIN   Take 400 mg by mouth every 6 (six) hours as needed. For muscle aches       lisinopril 5 MG tablet   Commonly known as: PRINIVIL,ZESTRIL   Take 1 tablet (5 mg total) by mouth daily. For control of high blood pressure       norethindrone 0.35 MG tablet   Commonly known as: MICRONOR,CAMILA,ERRIN   Take 1 tablet (0.35 mg total) by mouth daily. For contraception       sertraline 100 MG tablet   Commonly known as: ZOLOFT   Take 1 tablet (100 mg total) by mouth daily. For depression.            Follow-up Information    Follow up with MH  -Intensive Outpatient on 07/24/2012. (appt on Wednesday July 24, 2012 at 8:45a.m)    Contact information:   [address]  [place], [place]  [phone]        Follow-up recommendations:   Activities: Resume typical activities Diet: Resume typical diet Other: Follow up with outpatient provider and report any side effects to out patient prescriber.  Comments:  Take all your medications as prescribed by your mental healthcare provider. Report any adverse effects and or reactions from your medicines to your outpatient provider promptly. Patient is instructed and cautioned to not engage in alcohol and or illegal drug use while on prescription medicines. In the event of worsening symptoms, patient is instructed to call the crisis hotline, 911 and or go to the nearest ED for appropriate evaluation and treatment of symptoms.  SignedDan Humphreys, Krystiana Fornes 07/26/2012 12:24 AM

## 2012-07-29 ENCOUNTER — Other Ambulatory Visit (HOSPITAL_COMMUNITY): Payer: BC Managed Care – PPO

## 2012-07-30 ENCOUNTER — Other Ambulatory Visit (HOSPITAL_COMMUNITY): Payer: BC Managed Care – PPO

## 2012-07-31 ENCOUNTER — Other Ambulatory Visit (HOSPITAL_COMMUNITY): Payer: BC Managed Care – PPO

## 2012-08-01 ENCOUNTER — Other Ambulatory Visit (HOSPITAL_COMMUNITY): Payer: BC Managed Care – PPO

## 2012-08-02 ENCOUNTER — Other Ambulatory Visit (HOSPITAL_COMMUNITY): Payer: BC Managed Care – PPO

## 2012-08-05 ENCOUNTER — Other Ambulatory Visit (HOSPITAL_COMMUNITY): Payer: BC Managed Care – PPO

## 2012-08-06 ENCOUNTER — Other Ambulatory Visit (HOSPITAL_COMMUNITY): Payer: BC Managed Care – PPO

## 2012-08-07 ENCOUNTER — Other Ambulatory Visit (HOSPITAL_COMMUNITY): Payer: BC Managed Care – PPO

## 2012-08-08 ENCOUNTER — Other Ambulatory Visit (HOSPITAL_COMMUNITY): Payer: BC Managed Care – PPO

## 2012-08-09 ENCOUNTER — Other Ambulatory Visit (HOSPITAL_COMMUNITY): Payer: BC Managed Care – PPO

## 2012-08-12 ENCOUNTER — Other Ambulatory Visit (HOSPITAL_COMMUNITY): Payer: BC Managed Care – PPO

## 2012-08-13 ENCOUNTER — Other Ambulatory Visit (HOSPITAL_COMMUNITY): Payer: BC Managed Care – PPO

## 2012-08-14 ENCOUNTER — Other Ambulatory Visit (HOSPITAL_COMMUNITY): Payer: BC Managed Care – PPO

## 2012-08-20 ENCOUNTER — Other Ambulatory Visit: Payer: Self-pay | Admitting: Physician Assistant

## 2012-08-20 ENCOUNTER — Encounter: Payer: Self-pay | Admitting: Physician Assistant

## 2012-08-20 ENCOUNTER — Ambulatory Visit (INDEPENDENT_AMBULATORY_CARE_PROVIDER_SITE_OTHER): Payer: BC Managed Care – PPO | Admitting: Physician Assistant

## 2012-08-20 ENCOUNTER — Ambulatory Visit: Payer: BC Managed Care – PPO | Admitting: Physician Assistant

## 2012-08-20 ENCOUNTER — Ambulatory Visit (HOSPITAL_COMMUNITY): Payer: Self-pay | Admitting: Psychiatry

## 2012-08-20 VITALS — BP 118/90 | HR 90 | Temp 98.1°F | Resp 16 | Ht 59.0 in | Wt 151.0 lb

## 2012-08-20 DIAGNOSIS — F419 Anxiety disorder, unspecified: Secondary | ICD-10-CM

## 2012-08-20 DIAGNOSIS — F341 Dysthymic disorder: Secondary | ICD-10-CM

## 2012-08-20 DIAGNOSIS — F3281 Premenstrual dysphoric disorder: Secondary | ICD-10-CM

## 2012-08-20 DIAGNOSIS — N943 Premenstrual tension syndrome: Secondary | ICD-10-CM

## 2012-08-20 DIAGNOSIS — I10 Essential (primary) hypertension: Secondary | ICD-10-CM

## 2012-08-20 MED ORDER — CHLORPROMAZINE HCL 25 MG PO TABS: 25.0000 mg | ORAL_TABLET | Freq: Three times a day (TID) | ORAL | Status: DC

## 2012-08-20 MED ORDER — GABAPENTIN 100 MG PO CAPS: 100.0000 mg | ORAL_CAPSULE | Freq: Three times a day (TID) | ORAL | Status: DC

## 2012-08-20 MED ORDER — NORETHINDRONE 0.35 MG PO TABS: 1.0000 | ORAL_TABLET | Freq: Every day | ORAL | Status: DC

## 2012-08-20 MED ORDER — SERTRALINE HCL 100 MG PO TABS: 100.0000 mg | ORAL_TABLET | Freq: Every day | ORAL | Status: DC

## 2012-08-20 MED ORDER — LISINOPRIL 5 MG PO TABS: 5.0000 mg | ORAL_TABLET | Freq: Every day | ORAL | Status: DC

## 2012-08-20 NOTE — Assessment & Plan Note (Signed)
Essential hypertension, benign  lisinopril (PRINIVIL,ZESTRIL) 5 MG tablet

## 2012-08-20 NOTE — Progress Notes (Signed)
Subjective:    Patient ID: [name], female    DOB: [birth year], 36 y.o.   MRN: [id number]  HPI This 36 y.o. female presents for evaluation of depression/anxiety. She intentionally over-dosed last month by "taking a handful of pills" and was briefly admitted to behavioral health.  She canceled the follow-up with psychology and psychiatry due to finances.  Her husband has lost his job, they are (and have been) depending on their parents for financial help, and she will lose her insurance.  They are looking into getting coverage for her, possibly through Medicaid.  She denies SI/HI since her hospitalization.  Has reduced the Thorazine to QD and hopes to D/C (or at least reduce) the neurontin in the future.   Review of Systems No chest pain, SOB, HA, dizziness, vision change, N/V, diarrhea, constipation, dysuria, urinary urgency or frequency, myalgias, arthralgias or rash.    Past Medical History  Diagnosis Date  . Anxiety and depression     self-harm 07/2012  . Abnormal Pap smear of cervix 10/2007    colposcopy 12/2007  . Post partum depression   . HTN complicating peripregnancy, antepartum     Past Surgical History  Procedure Date  . Cesarean section     Prior to Admission medications   Medication Sig Start Date End Date Taking? Authorizing Provider  chlorproMAZINE (THORAZINE) 25 MG tablet Take 1 tablet (25 mg total) by mouth 3 (three) times daily. For anxiety 07/22/12 08/21/12 Yes Mike Craze, MD  gabapentin (NEURONTIN) 100 MG capsule Take 1 capsule (100 mg total) by mouth 3 (three) times daily. For anxiety 07/22/12 07/22/13 Yes Mike Craze, MD  hydrOXYzine (ATARAX/VISTARIL) 50 MG tablet Take 50 mg by mouth at bedtime as needed.   Yes Historical Provider, MD  ibuprofen (ADVIL,MOTRIN) 200 MG tablet Take 400 mg by mouth every 6 (six) hours as needed. For muscle aches   Yes Historical Provider, MD  lisinopril (PRINIVIL,ZESTRIL) 5 MG tablet Take 1 tablet (5 mg total) by mouth  daily. For control of high blood pressure 07/22/12  Yes Mike Craze, MD  norethindrone (MICRONOR,CAMILA,ERRIN) 0.35 MG tablet Take 1 tablet (0.35 mg total) by mouth daily. For contraception 07/22/12 07/22/13 Yes Mike Craze, MD  sertraline (ZOLOFT) 100 MG tablet Take 1 tablet (100 mg total) by mouth daily. For depression. 07/22/12  Yes Mike Craze, MD    Allergies  Allergen Reactions  . Amoxicillin Nausea Only    History   Social History  . Marital Status: Married    Spouse Name: Iline Buchinger    Number of Children: 2  . Years of Education: 19   Occupational History  . homemaker   . 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 Smoking status: Former Games developer  . Smokeless tobacco: Never Used  . Alcohol Use: No  . Drug Use: No     previous marijuana to self-treat anxiety  . Sexually Active: Yes -- Female partner(s)    Birth Control/ Protection: Pill, Surgical   Other Topics Concern  . Not on file   Social History Narrative   Step-father adopted this patient and raised her.    Family History  Problem Relation Age of Onset  . Mental illness Mother     depression       Objective:   Physical Exam Blood pressure 118/90, pulse 90, temperature 98.1 F (36.7 C), temperature source Oral, resp. rate 16, height 4\' 11"  (1.499  m), weight 151 lb (68.493 kg), last menstrual period 07/30/2012, SpO2 99.00%. Body mass index is 30.50 kg/(m^2). Well-developed, well nourished WF who is awake, alert and oriented, in NAD. HEENT: /AT, PERRL, EOMI.  Sclera and conjunctiva are clear.  EAC are patent, TMs are normal in appearance. Nasal mucosa is pink and moist. OP is clear. Neck: supple, non-tender, no lymphadenopathy, thyromegaly. Heart: RRR, no murmur Lungs: CTA Abdomen: normo-active bowel sounds, supple, non-tender, no mass or organomegaly. Extremities: no cyanosis, clubbing or edema. Skin: warm and dry without rash. Psychiatric:  Mood is  depressed, affect appropriate.  Normal speech, thoughts.  Normal behavior.     Assessment & Plan:  Refilled meds x 90 days.  Advised that she proceed with psychologist as soon as she is financially able.  RTC 3 months, sooner if needed.

## 2012-08-20 NOTE — Patient Instructions (Signed)
Once you get some kind of health coverage, lets go ahead and get an appointment with a psychologist.  Then we'll get you back in to see a psychiatrist for medication adjustments.

## 2012-08-20 NOTE — Assessment & Plan Note (Signed)
1. Anxiety and depression  sertraline (ZOLOFT) 100 MG tablet, gabapentin (NEURONTIN) 100 MG capsule, chlorproMAZINE (THORAZINE) 25 MG tablet  2. PMDD (premenstrual dysphoric disorder)  norethindrone (MICRONOR,CAMILA,ERRIN) 0.35 MG tablet   Patient Instructions  Once you get some kind of health coverage, lets go ahead and get an appointment with a psychologist.  Then we'll get you back in to see a psychiatrist for medication adjustments.

## 2012-08-28 ENCOUNTER — Ambulatory Visit (HOSPITAL_COMMUNITY): Payer: Self-pay | Admitting: Psychiatry

## 2012-09-23 ENCOUNTER — Other Ambulatory Visit: Payer: Self-pay | Admitting: Physician Assistant

## 2012-10-31 ENCOUNTER — Ambulatory Visit: Payer: Self-pay | Admitting: Family Medicine

## 2012-10-31 ENCOUNTER — Encounter: Payer: Self-pay | Admitting: Family Medicine

## 2012-10-31 VITALS — BP 120/70 | HR 74 | Temp 98.3°F | Resp 16 | Ht 60.0 in | Wt 155.0 lb

## 2012-10-31 DIAGNOSIS — J309 Allergic rhinitis, unspecified: Secondary | ICD-10-CM

## 2012-10-31 DIAGNOSIS — J40 Bronchitis, not specified as acute or chronic: Secondary | ICD-10-CM

## 2012-10-31 MED ORDER — AZITHROMYCIN 250 MG PO TABS: ORAL_TABLET | ORAL | Status: DC

## 2012-10-31 NOTE — Progress Notes (Signed)
  Subjective:    Patient ID: [name], female    DOB: 1976/09/06, 36 y.o.   MRN: [id number]  HPI [name] is a 36 y.o. female Cough, congestion with some dark phlegm at times. Cough for about a month - on and off.  Congestion, sore throat, fatigue.  Fever here and there - tmax 100. Last fever last night. Cough worse than sore throat.   Tx: dayquil, advil. Zyrtec helps a little.    Review of Systems  Constitutional: Positive for fever and chills.  HENT: Positive for congestion, trouble swallowing and sinus pressure.   Respiratory: Positive for cough. Negative for shortness of breath (at times - but able to go to gym and workout ok. ).        Objective:   Physical Exam  Constitutional: She is oriented to person, place, and time. She appears well-developed and well-nourished. No distress.  HENT:  Head: Normocephalic and atraumatic.  Right Ear: Hearing, tympanic membrane, external ear and ear canal normal.  Left Ear: Hearing, tympanic membrane, external ear and ear canal normal.  Nose: Nose normal.  Mouth/Throat: Oropharynx is clear and moist. No oropharyngeal exudate.  Eyes: Conjunctivae normal and EOM are normal. Pupils are equal, round, and reactive to light.  Cardiovascular: Normal rate, regular rhythm, normal heart sounds and intact distal pulses.   No murmur heard. Pulmonary/Chest: Effort normal and breath sounds normal. No respiratory distress. She has no wheezes. She has no rhonchi. She has no rales.  Neurological: She is alert and oriented to person, place, and time.  Skin: Skin is warm and dry. No rash noted.  Psychiatric: She has a normal mood and affect. Her behavior is normal.       Assessment & Plan:  [name] is a 36 y.o. female 1. Bronchitis  azithromycin (ZITHROMAX) 250 MG tablet  2. Allergic rhinitis      Likely combo of AR and viral uri - now with persistent productive cough - bronchitis.  Zpak, zyrtec, mucinex, rtc precautions  discussed.   Patient Instructions  Saline nasal spray atleast 4 times per day for nasal congestion, zyrtec for allergies, over the counter mucinex or mucinex DM as needed for cough, drink plenty of fluids.  Return to the clinic or go to the nearest emergency room if any of your symptoms worsen or new symptoms occur.

## 2012-10-31 NOTE — Patient Instructions (Signed)
Saline nasal spray atleast 4 times per day for nasal congestion, zyrtec for allergies, over the counter mucinex or mucinex DM as needed for cough, drink plenty of fluids.  Return to the clinic or go to the nearest emergency room if any of your symptoms worsen or new symptoms occur.

## 2012-11-28 ENCOUNTER — Ambulatory Visit: Payer: Self-pay | Admitting: Family Medicine

## 2012-11-28 VITALS — BP 115/80 | HR 80 | Temp 98.6°F | Resp 18 | Ht 59.0 in | Wt 156.0 lb

## 2012-11-28 DIAGNOSIS — N390 Urinary tract infection, site not specified: Secondary | ICD-10-CM

## 2012-11-28 LAB — POCT URINALYSIS DIPSTICK
Bilirubin, UA: NEGATIVE
Blood, UA: NEGATIVE
Glucose, UA: NEGATIVE
Ketones, UA: NEGATIVE
Leukocytes, UA: NEGATIVE
Nitrite, UA: NEGATIVE
Protein, UA: NEGATIVE
Spec Grav, UA: 1.01
Urobilinogen, UA: 0.2
pH, UA: 6

## 2012-11-28 LAB — POCT UA - MICROSCOPIC ONLY
Bacteria, U Microscopic: NEGATIVE
Casts, Ur, LPF, POC: NEGATIVE
Crystals, Ur, HPF, POC: NEGATIVE
Mucus, UA: NEGATIVE
RBC, urine, microscopic: NEGATIVE
WBC, Ur, HPF, POC: NEGATIVE
Yeast, UA: NEGATIVE

## 2012-11-28 MED ORDER — NITROFURANTOIN MONOHYD MACRO 100 MG PO CAPS: 100.0000 mg | ORAL_CAPSULE | Freq: Two times a day (BID) | ORAL | Status: DC

## 2012-11-28 NOTE — Progress Notes (Signed)
@UMFCLOGO @   Patient ID: [name] MRN: [id number], DOB: [birth year], 36 y.o. Date of Encounter: 11/28/2012, 1:15 PM  Primary Physician: Porfirio Oar, PA-C  Chief Complaint:  Chief Complaint  Patient presents with  . Urinary Tract Infection    HPI: 36 y.o. year old female presents with 14 day history of dysuria, urgency, and frequency. Last UTI was years ago No hematuria LMP:  About a month ago No sick contacts, recent antibiotics, or recent travels.   No vaginal discharge, back pain, fever  Past Medical History  Diagnosis Date  . Anxiety and depression     self-harm 07/2012  . Abnormal Pap smear of cervix 10/2007    colposcopy 12/2007  . Post partum depression   . HTN complicating peripregnancy, antepartum      Home Meds: Prior to Admission medications   Medication Sig Start Date End Date Taking? Authorizing Provider  lisinopril (PRINIVIL,ZESTRIL) 5 MG tablet Take 1 tablet (5 mg total) by mouth daily. For control of high blood pressure 08/20/12  Yes Chelle S Jeffery, PA-C  sertraline (ZOLOFT) 100 MG tablet Take 1 tablet (100 mg total) by mouth daily. For depression. 08/20/12  Yes Chelle Tessa Lerner, PA-C    Allergies:  Allergies  Allergen Reactions  . Amoxicillin Nausea Only    History   Social History  . Marital Status: Married    Spouse Name: [name]    Number of Children: 2  . Years of Education: 19   Occupational History  . homemaker   . 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 Smoking status: Former Games developer  . Smokeless tobacco: Never Used  . Alcohol Use: No  . Drug Use: No     Comment: previous marijuana to self-treat anxiety  . Sexually Active: Yes -- Female partner(s)    Birth Control/ Protection: Pill, Surgical   Other Topics Concern  . Not on file   Social History Narrative   Step-father adopted this patient and raised her.     Review of Systems: Constitutional: negative for chills, fever,  night sweats or weight changes Cardiovascular: negative for chest pain or palpitations Respiratory: negative for hemoptysis, wheezing, or shortness of breath Abdominal: negative for abdominal pain, nausea, vomiting or diarrhea Dermatological: negative for rash Neurologic: negative for headache   Physical Exam: Blood pressure 115/80, pulse 80, temperature 98.6 F (37 C), temperature source Oral, resp. rate 18, height 4\' 11"  (1.499 m), weight 156 lb (70.761 kg), last menstrual period 10/29/2012., Body mass index is 31.51 kg/(m^2). General: Well developed, well nourished, in no acute distress. Head: Normocephalic, atraumatic, eyes without discharge, sclera non-icteric, nares are congested. Bilateral auditory canals clear, TM's are without perforation, pearly grey with reflective cone of light bilaterally. Serous effusion bilaterally behind TM's. Maxillary sinus TTP. Oral cavity moist, dentition normal. Posterior pharynx with post nasal drip and mild erythema. No peritonsillar abscess or tonsillar exudate. Neck: Supple. No thyromegaly. Full ROM. No lymphadenopathy. Lungs: Coarse breath sounds bilaterally without Clear bilaterally to auscultation without wheezes, rales, or rhonchi. Breathing is unlabored.  Heart: RRR with S1 S2. No murmurs, rubs, or gallops appreciated. Abdomen: Soft, non-tender, non-distended with normoactive bowel sounds. No hepatosplenomegaly. No rebound/guarding. No obvious abdominal masses. McBurney's, Rovsing's, Iliopsoas, and table jar all negative. Msk:  Strength and tone normal for 36. Extremities: No clubbing or cyanosis. No edema. Neuro: Alert and oriented X 3. Moves all extremities spontaneously. CNII-XII grossly in tact. Psych:  Responds to questions  appropriately with a normal affect.         ASSESSMENT AND PLAN:  36 y.o. year old female with  - -Mucinex -Tylenol/Motrin prn -Rest/fluids -RTC precautions -RTC 3-5 days if no improvement  Signed, Elvina Sidle, MD 11/28/2012 1:15 PM

## 2012-11-28 NOTE — Patient Instructions (Addendum)
Urinary Tract Infection Urinary tract infections (UTIs) can develop anywhere along your urinary tract. Your urinary tract is your body's drainage system for removing wastes and extra water. Your urinary tract includes two kidneys, two ureters, a bladder, and a urethra. Your kidneys are a pair of bean-shaped organs. Each kidney is about the size of your fist. They are located below your ribs, one on each side of your spine. CAUSES Infections are caused by microbes, which are microscopic organisms, including fungi, viruses, and bacteria. These organisms are so small that they can only be seen through a microscope. Bacteria are the microbes that most commonly cause UTIs. SYMPTOMS  Symptoms of UTIs may vary by age and gender of the patient and by the location of the infection. Symptoms in young women typically include a frequent and intense urge to urinate and a painful, burning feeling in the bladder or urethra during urination. Older women and men are more likely to be tired, shaky, and weak and have muscle aches and abdominal pain. A fever may mean the infection is in your kidneys. Other symptoms of a kidney infection include pain in your back or sides below the ribs, nausea, and vomiting. DIAGNOSIS To diagnose a UTI, your caregiver will ask you about your symptoms. Your caregiver also will ask to provide a urine sample. The urine sample will be tested for bacteria and white blood cells. White blood cells are made by your body to help fight infection. TREATMENT  Typically, UTIs can be treated with medication. Because most UTIs are caused by a bacterial infection, they usually can be treated with the use of antibiotics. The choice of antibiotic and length of treatment depend on your symptoms and the type of bacteria causing your infection. HOME CARE INSTRUCTIONS  If you were prescribed antibiotics, take them exactly as your caregiver instructs you. Finish the medication even if you feel better after you  have only taken some of the medication.  Drink enough water and fluids to keep your urine clear or pale yellow.  Avoid caffeine, tea, and carbonated beverages. They tend to irritate your bladder.  Empty your bladder often. Avoid holding urine for long periods of time.  Empty your bladder before and after sexual intercourse.  After a bowel movement, women should cleanse from front to back. Use each tissue only once. SEEK MEDICAL CARE IF:   You have back pain.  You develop a fever.  Your symptoms do not begin to resolve within 3 days. SEEK IMMEDIATE MEDICAL CARE IF:   You have severe back pain or lower abdominal pain.  You develop chills.  You have nausea or vomiting.  You have continued burning or discomfort with urination. MAKE SURE YOU:   Understand these instructions.  Will watch your condition.  Will get help right away if you are not doing well or get worse. Document Released: 09/20/2005 Document Revised: 06/11/2012 Document Reviewed: 01/19/2012 ExitCare Patient Information 2013 [employer].  

## 2012-11-30 LAB — URINE CULTURE
Colony Count: NO GROWTH
Organism ID, Bacteria: NO GROWTH

## 2013-01-23 ENCOUNTER — Other Ambulatory Visit: Payer: Self-pay | Admitting: Physician Assistant

## 2013-01-23 NOTE — Telephone Encounter (Signed)
Pt last seen Aug 2013 after d/c from behavioral health - needs follow-up

## 2013-02-13 ENCOUNTER — Ambulatory Visit: Payer: Self-pay | Admitting: Physician Assistant

## 2013-02-13 VITALS — BP 131/97 | HR 96 | Temp 98.6°F | Resp 16 | Ht 60.0 in | Wt 156.0 lb

## 2013-02-13 DIAGNOSIS — J309 Allergic rhinitis, unspecified: Secondary | ICD-10-CM

## 2013-02-13 DIAGNOSIS — R05 Cough: Secondary | ICD-10-CM

## 2013-02-13 DIAGNOSIS — F329 Major depressive disorder, single episode, unspecified: Secondary | ICD-10-CM

## 2013-02-13 DIAGNOSIS — F341 Dysthymic disorder: Secondary | ICD-10-CM

## 2013-02-13 DIAGNOSIS — I10 Essential (primary) hypertension: Secondary | ICD-10-CM

## 2013-02-13 DIAGNOSIS — F3281 Premenstrual dysphoric disorder: Secondary | ICD-10-CM

## 2013-02-13 DIAGNOSIS — N943 Premenstrual tension syndrome: Secondary | ICD-10-CM

## 2013-02-13 MED ORDER — BUPROPION HCL ER (XL) 150 MG PO TB24: 150.0000 mg | ORAL_TABLET | Freq: Every day | ORAL | Status: DC

## 2013-02-13 MED ORDER — FLUTICASONE PROPIONATE 50 MCG/ACT NA SUSP: 2.0000 | Freq: Every day | NASAL | Status: DC

## 2013-02-15 ENCOUNTER — Encounter: Payer: Self-pay | Admitting: Physician Assistant

## 2013-02-15 NOTE — Assessment & Plan Note (Signed)
Continue current dose lisinopril.  RTC 6 months.

## 2013-02-15 NOTE — Assessment & Plan Note (Signed)
Restarted Wellbutrin XL 150 mg 1 PO QD.  Consider increasing dose or decreasing sertraline dose depending on effect.

## 2013-02-15 NOTE — Progress Notes (Signed)
  Subjective:    Patient ID: [name], female    DOB: 11/05/1976, 37 y.o.   MRN: [id number]  HPI This 37 y.o. female presents for evaluation of depression and cough.    Finds that her depression is well controlled except in the week prior to her menses, during which she feels extremely dysphoric.  Would like to add something to help.  No thoughts of self or other harm. Previuosly took Wellbutrin with success.  Cough x 4 weeks, associated with fatigue and sore throat.  Thinks it's probably associated with allergies, recently worse with growth of mold in the home, but her mother suggested it might be Mono and she'd like to be tested.   Past Medical History  Diagnosis Date  . Anxiety and depression     self-harm 07/2012  . Abnormal Pap smear of cervix 10/2007    colposcopy 12/2007  . Post partum depression   . HTN complicating peripregnancy, antepartum     Past Surgical History  Procedure Laterality Date  . Cesarean section      Prior to Admission medications   Medication Sig Start Date End Date Taking? Authorizing Provider  lisinopril (PRINIVIL,ZESTRIL) 5 MG tablet Take 1 tablet (5 mg total) by mouth daily. For control of high blood pressure 08/20/12  Yes Marv Alfrey S Taytum Wheller, PA-C  sertraline (ZOLOFT) 100 MG tablet Take 1 tablet (100 mg total) by mouth daily. NEED VISIT! 01/23/13  Yes Godfrey Pick, PA-C    Allergies  Allergen Reactions  . Amoxicillin Nausea Only    History   Social History  . Marital Status: Married    Spouse Name: [name]    Number of Children: 2  . Years of Education: 19   Occupational History  . homemaker   . 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 Smoking status: Former Games developer  . Smokeless tobacco: Never Used  . Alcohol Use: No  . Drug Use: No     Comment: previous marijuana to self-treat anxiety  . Sexually Active: Yes -- Female partner(s)    Birth Control/ Protection: Surgical   Family  History  Problem Relation Age of Onset  . Mental illness Mother     depression  . Hypertension Maternal Grandmother      Review of Systems As above.  No chest pain, SOB, HA, dizziness, vision change, N/V, diarrhea, constipation, dysuria, urinary urgency or frequency, myalgias, arthralgias or rash.     Objective:   Physical Exam Blood pressure 131/97, pulse 96, temperature 98.6 F (37 C), resp. rate 16, height 5' (1.524 m), weight 156 lb (70.761 kg). Body mass index is 30.47 kg/(m^2). Well-developed, well nourished WF who is awake, alert and oriented, in NAD. HEENT: [place]/AT, PERRL, EOMI.  Sclera and conjunctiva are clear.  EAC are patent, TMs are normal in appearance. Nasal mucosa is pink and moist. OP is clear. Neck: supple, non-tender, no lymphadenopathy, thyromegaly. Heart: RRR, no murmur Lungs: normal effort, CTA Abdomen: normo-active bowel sounds, supple, non-tender, no mass or organomegaly. Extremities: no cyanosis, clubbing or edema. Skin: warm and dry without rash. Psychologic: good mood and appropriate affect, normal speech and behavior.  Declined lab testing after discussion.     Assessment & Plan:  Anxiety and depression - Plan: buPROPion (WELLBUTRIN XL) 150 MG 24 hr tablet  PMDD (premenstrual dysphoric disorder)  Cough  Allergic rhinitis - Plan: fluticasone (FLONASE) 50 MCG/ACT nasal spray

## 2013-05-02 ENCOUNTER — Other Ambulatory Visit: Payer: Self-pay | Admitting: Physician Assistant

## 2013-09-02 ENCOUNTER — Other Ambulatory Visit: Payer: Self-pay | Admitting: Physician Assistant

## 2013-09-10 ENCOUNTER — Other Ambulatory Visit: Payer: Self-pay | Admitting: Physician Assistant

## 2013-10-16 ENCOUNTER — Other Ambulatory Visit: Payer: Self-pay | Admitting: Physician Assistant

## 2013-10-31 ENCOUNTER — Other Ambulatory Visit: Payer: Self-pay | Admitting: Physician Assistant

## 2013-11-26 ENCOUNTER — Other Ambulatory Visit: Payer: Self-pay | Admitting: Physician Assistant

## 2013-11-27 ENCOUNTER — Encounter: Payer: Self-pay | Admitting: Physician Assistant

## 2013-11-27 ENCOUNTER — Ambulatory Visit (INDEPENDENT_AMBULATORY_CARE_PROVIDER_SITE_OTHER): Payer: 59 | Admitting: Physician Assistant

## 2013-11-27 VITALS — BP 111/77 | HR 67 | Temp 98.4°F | Resp 16 | Ht 60.5 in | Wt 161.0 lb

## 2013-11-27 DIAGNOSIS — J309 Allergic rhinitis, unspecified: Secondary | ICD-10-CM

## 2013-11-27 DIAGNOSIS — F329 Major depressive disorder, single episode, unspecified: Secondary | ICD-10-CM

## 2013-11-27 DIAGNOSIS — Z23 Encounter for immunization: Secondary | ICD-10-CM

## 2013-11-27 DIAGNOSIS — F341 Dysthymic disorder: Secondary | ICD-10-CM

## 2013-11-27 DIAGNOSIS — I10 Essential (primary) hypertension: Secondary | ICD-10-CM

## 2013-11-27 MED ORDER — BUPROPION HCL ER (XL) 150 MG PO TB24: 150.0000 mg | ORAL_TABLET | ORAL | Status: DC

## 2013-11-27 MED ORDER — SERTRALINE HCL 100 MG PO TABS: 100.0000 mg | ORAL_TABLET | Freq: Every day | ORAL | Status: DC

## 2013-11-27 MED ORDER — LISINOPRIL 5 MG PO TABS: 5.0000 mg | ORAL_TABLET | Freq: Every day | ORAL | Status: DC

## 2013-11-27 NOTE — Patient Instructions (Signed)
Keep up the good work of being happy!

## 2013-11-27 NOTE — Progress Notes (Signed)
   Subjective:    Patient ID: [name], female    DOB: 1976-01-26, 37 y.o.   MRN: [id number]  Chief Complaint  Patient presents with  . Medication Refill   Medications, allergies, past medical history, surgical history, family history, social history and problem list reviewed and updated.  Patient Active Problem List   Diagnosis Date Noted  . HTN (hypertension) 08/20/2012  . Anxiety and depression   . OVERWEIGHT 10/10/2007    HPI  Doing well.  Feels good. Thinks her current regimen is working well.  No thoughts of harming herself or others.   Review of Systems No chest pain, SOB, HA, dizziness, vision change, N/V, diarrhea, constipation, dysuria, urinary urgency or frequency, myalgias, arthralgias or rash.     Objective:   Physical Exam Blood pressure 111/77, pulse 67, temperature 98.4 F (36.9 C), resp. rate 16, height 5' 0.5" (1.537 m), weight 161 lb (73.029 kg), last menstrual period 11/13/2013. Body mass index is 30.91 kg/(m^2). Well-developed, well nourished WF who is awake, alert and oriented, in NAD. HEENT: [place]/AT, PERRL, EOMI.  Sclera and conjunctiva are clear.  EAC are patent, TMs are normal in appearance. Nasal mucosa is pink and moist. OP is clear. Neck: supple, non-tender, no lymphadenopathy, thyromegaly. Heart: RRR, no murmur Lungs: normal effort, CTA Extremities: no cyanosis, clubbing or edema. Skin: warm and dry without rash. Psychologic: good mood and appropriate affect, normal speech and behavior.        Assessment & Plan:  1. HTN (hypertension) Controlled.  Continue current treatment. - lisinopril (PRINIVIL,ZESTRIL) 5 MG tablet; Take 1 tablet (5 mg total) by mouth daily.  Dispense: 90 tablet; Refill: 3  2. Anxiety and depression Controlled/stable. Continue current treatment. - buPROPion (WELLBUTRIN XL) 150 MG 24 hr tablet; Take 1 tablet (150 mg total) by mouth every morning.  Dispense: 90 tablet; Refill: 3 - sertraline (ZOLOFT) 100 MG  tablet; Take 1 tablet (100 mg total) by mouth daily.  Dispense: 90 tablet; Refill: 3  3. Need for prophylactic vaccination and inoculation against influenza - Flu Vaccine QUAD 36+ mos IM  4. Allergic rhinitis Stable.   Fernande Bras, PA-C Physician Assistant-Certified [facility]

## 2014-01-12 ENCOUNTER — Other Ambulatory Visit: Payer: Self-pay | Admitting: Physician Assistant

## 2014-01-22 ENCOUNTER — Encounter: Payer: Self-pay | Admitting: Physician Assistant

## 2014-01-22 ENCOUNTER — Ambulatory Visit (INDEPENDENT_AMBULATORY_CARE_PROVIDER_SITE_OTHER): Payer: 59 | Admitting: Physician Assistant

## 2014-01-22 VITALS — BP 120/88 | HR 86 | Temp 99.0°F | Resp 16 | Ht 60.0 in | Wt 162.0 lb

## 2014-01-22 DIAGNOSIS — N943 Premenstrual tension syndrome: Secondary | ICD-10-CM

## 2014-01-22 DIAGNOSIS — F3281 Premenstrual dysphoric disorder: Secondary | ICD-10-CM

## 2014-01-22 DIAGNOSIS — F341 Dysthymic disorder: Secondary | ICD-10-CM

## 2014-01-22 DIAGNOSIS — Z23 Encounter for immunization: Secondary | ICD-10-CM

## 2014-01-22 DIAGNOSIS — J309 Allergic rhinitis, unspecified: Secondary | ICD-10-CM

## 2014-01-22 DIAGNOSIS — F32A Depression, unspecified: Secondary | ICD-10-CM

## 2014-01-22 DIAGNOSIS — Z0000 Encounter for general adult medical examination without abnormal findings: Secondary | ICD-10-CM

## 2014-01-22 DIAGNOSIS — F419 Anxiety disorder, unspecified: Secondary | ICD-10-CM

## 2014-01-22 DIAGNOSIS — R87810 Cervical high risk human papillomavirus (HPV) DNA test positive: Secondary | ICD-10-CM

## 2014-01-22 DIAGNOSIS — F329 Major depressive disorder, single episode, unspecified: Secondary | ICD-10-CM

## 2014-01-22 DIAGNOSIS — Z124 Encounter for screening for malignant neoplasm of cervix: Secondary | ICD-10-CM

## 2014-01-22 DIAGNOSIS — R5381 Other malaise: Secondary | ICD-10-CM

## 2014-01-22 DIAGNOSIS — I10 Essential (primary) hypertension: Secondary | ICD-10-CM

## 2014-01-22 DIAGNOSIS — IMO0002 Reserved for concepts with insufficient information to code with codable children: Secondary | ICD-10-CM

## 2014-01-22 DIAGNOSIS — R5383 Other fatigue: Secondary | ICD-10-CM

## 2014-01-22 DIAGNOSIS — M2550 Pain in unspecified joint: Secondary | ICD-10-CM

## 2014-01-22 LAB — POCT URINALYSIS DIPSTICK
Bilirubin, UA: NEGATIVE
Blood, UA: NEGATIVE
Glucose, UA: NEGATIVE
Ketones, UA: NEGATIVE
LEUKOCYTES UA: NEGATIVE
Nitrite, UA: NEGATIVE
PROTEIN UA: NEGATIVE
Spec Grav, UA: <=1.005
UROBILINOGEN UA: 0.2
pH, UA: 6

## 2014-01-22 LAB — CBC WITH DIFFERENTIAL/PLATELET
BASOS PCT: 1 % (ref 0–1)
Basophils Absolute: 0.1 10*3/uL (ref 0.0–0.1)
EOS ABS: 0.2 10*3/uL (ref 0.0–0.7)
Eosinophils Relative: 3 % (ref 0–5)
HCT: 39.8 % (ref 36.0–46.0)
Hemoglobin: 13.6 g/dL (ref 12.0–15.0)
LYMPHS ABS: 1.8 10*3/uL (ref 0.7–4.0)
Lymphocytes Relative: 30 % (ref 12–46)
MCH: 30.2 pg (ref 26.0–34.0)
MCHC: 34.2 g/dL (ref 30.0–36.0)
MCV: 88.4 fL (ref 78.0–100.0)
Monocytes Absolute: 0.6 10*3/uL (ref 0.1–1.0)
Monocytes Relative: 10 % (ref 3–12)
NEUTROS PCT: 56 % (ref 43–77)
Neutro Abs: 3.3 10*3/uL (ref 1.7–7.7)
PLATELETS: 241 10*3/uL (ref 150–400)
RBC: 4.5 MIL/uL (ref 3.87–5.11)
RDW: 13 % (ref 11.5–15.5)
WBC: 5.8 10*3/uL (ref 4.0–10.5)

## 2014-01-22 LAB — POCT UA - MICROSCOPIC ONLY
Bacteria, U Microscopic: NEGATIVE
CASTS, UR, LPF, POC: NEGATIVE
CRYSTALS, UR, HPF, POC: NEGATIVE
EPITHELIAL CELLS, URINE PER MICROSCOPY: NEGATIVE
Mucus, UA: NEGATIVE
RBC, urine, microscopic: NEGATIVE
WBC, Ur, HPF, POC: NEGATIVE
YEAST UA: NEGATIVE

## 2014-01-22 LAB — COMPREHENSIVE METABOLIC PANEL
ALK PHOS: 39 U/L (ref 39–117)
ALT: 13 U/L (ref 0–35)
AST: 14 U/L (ref 0–37)
Albumin: 4.4 g/dL (ref 3.5–5.2)
BILIRUBIN TOTAL: 0.3 mg/dL (ref 0.2–1.2)
BUN: 13 mg/dL (ref 6–23)
CO2: 29 mEq/L (ref 19–32)
Calcium: 9.1 mg/dL (ref 8.4–10.5)
Chloride: 102 mEq/L (ref 96–112)
Creat: 0.69 mg/dL (ref 0.50–1.10)
Glucose, Bld: 86 mg/dL (ref 70–99)
Potassium: 4.4 mEq/L (ref 3.5–5.3)
SODIUM: 138 meq/L (ref 135–145)
TOTAL PROTEIN: 6.9 g/dL (ref 6.0–8.3)

## 2014-01-22 LAB — LIPID PANEL
Cholesterol: 150 mg/dL (ref 0–200)
HDL: 44 mg/dL (ref >39)
LDL CALC: 82 mg/dL (ref 0–99)
Total CHOL/HDL Ratio: 3.4 Ratio
Triglycerides: 122 mg/dL (ref <150)
VLDL: 24 mg/dL (ref 0–40)

## 2014-01-22 LAB — GLUCOSE, POCT (MANUAL RESULT ENTRY): POC GLUCOSE: 83 mg/dL (ref 70–99)

## 2014-01-22 LAB — POCT SEDIMENTATION RATE: POCT SED RATE: 12 mm/hr (ref 0–22)

## 2014-01-22 LAB — RHEUMATOID FACTOR

## 2014-01-22 MED ORDER — BUPROPION HCL ER (XL) 300 MG PO TB24: 300.0000 mg | ORAL_TABLET | ORAL | Status: DC

## 2014-01-22 NOTE — Progress Notes (Signed)
Subjective:    Patient ID: [name], female    DOB: 02/18/1976, 38 y.o.   MRN: [id number]  HPI  Patient presents today for CPE.   Has bitten by a tick over the summer and would like to be screened for Lyme Disease. She has recently been experiencing joint pain in her hips and fatigue.   Has "bumps in the back of my throat". They do not hurt or cause irritation.   Has a hx of asthma since college. Dust and mold aggravate her allergies. Notes nasal congestion, sneezing, headache, cough, sore throat, post nasal drip, itchy/watery eyes. Takes zyrtec daily for symptom relief which seems to help. Has Flonase but does not use it regularly.   She would like to increase Wellbutrin to help with depression symptoms and to increase energy. Has had general feelings of sadness and overwhelmed. Her mother just had surgery which was stressful and her "family issues" are making her depression. Has not had thoughts of wanting to hurt herself or anybody else.   Reports dyspareunia with deep penetration.   BP is well controlled and tolerating lisinopril well.   Review of Systems  Constitutional: Positive for fever (intermittent over past 2 weeks). Negative for chills.  Respiratory: Positive for chest tightness (with cough) and shortness of breath (with allergy flare).   Cardiovascular: Negative for chest pain and palpitations.  Gastrointestinal: Negative for nausea, vomiting, abdominal pain, diarrhea, constipation and blood in stool.  Endocrine: Negative for polydipsia and polyphagia.  Genitourinary: Positive for frequency. Negative for dysuria, urgency and hematuria.  Musculoskeletal: Negative for myalgias.  Skin: Negative for rash.  Neurological: Negative for dizziness and light-headedness.  Psychiatric/Behavioral: Negative for sleep disturbance.       Objective:   Physical Exam  Constitutional: She is oriented to person, place, and time.  Eyes: Conjunctivae, EOM and lids are normal.  Pupils are equal, round, and reactive to light.  Fundoscopic exam:      The right eye shows no arteriolar narrowing, no AV nicking, no exudate, no hemorrhage and no papilledema. The right eye shows red reflex.       The left eye shows no arteriolar narrowing, no AV nicking, no exudate, no hemorrhage and no papilledema. The left eye shows red reflex.  Neck: Normal range of motion. Neck supple. No thyromegaly present.  Cardiovascular: Normal rate, regular rhythm, normal heart sounds and normal pulses.   Pulmonary/Chest: Effort normal and breath sounds normal.  Abdominal: Soft. Normal appearance and bowel sounds are normal. There is tenderness in the right lower quadrant and left upper quadrant. There is no rigidity, no rebound and no guarding.  Genitourinary: Vagina normal. No breast swelling or tenderness. There is no rash, tenderness or lesion on the right labia. There is no rash, tenderness or lesion on the left labia. Cervix exhibits no motion tenderness and no friability.  Lymphadenopathy:       Head (right side): No submental, no submandibular, no tonsillar, no preauricular, no posterior auricular and no occipital adenopathy present.       Head (left side): No submental, no submandibular, no tonsillar, no preauricular, no posterior auricular and no occipital adenopathy present.    She has no cervical adenopathy.       Right: No inguinal and no supraclavicular adenopathy present.       Left: No inguinal and no supraclavicular adenopathy present.  Neurological: She is alert and oriented to person, place, and time. She has normal strength. No cranial nerve deficit or  sensory deficit. Coordination normal.  Reflex Scores:      Brachioradialis reflexes are 2+ on the right side and 2+ on the left side.      Patellar reflexes are 2+ on the right side and 2+ on the left side.       Assessment & Plan:    1. Routine general medical examination at a health care facility Age appropriate  anticipatory guidance provided.  2. HTN (hypertension) COntrolled. Continue medication as prescribed.  - CBC with Differential - Comprehensive metabolic panel - Lipid panel - POCT UA - Microscopic Only - POCT urinalysis dipstick  3. Anxiety and depression Increase Wellbutrin to 300 mg daily for better control of depression symptoms.  - buPROPion (WELLBUTRIN XL) 300 MG 24 hr tablet; Take 1 tablet (300 mg total) by mouth every morning.  Dispense: 90 tablet; Refill: 3  4. Allergic rhinitis Use flonase daily and see if symptoms improve. May try claritin or allegra instead of zyrtec.   5. PMDD (premenstrual dysphoric disorder) As per #3  6. Screening for cervical cancer - Pap IG and HPV (high risk) DNA detection; if both negative/normal, repeat both in 5 years  7. Dyspareunia Try female on top position to see if alleviates pain. Ordered pelvic and transvaginal US to further evaluate. Cystic ovaries could contribute to this, as could constipation (which is unlikely by history)  - US Pelvis Complete; Future - US Transvaginal Non-OB; Future  8. Fatigue - TSH - POCT glucose (manual entry)  9. Joint pain Will call with results of labs. Consider referral based on results.  - B. burgdorfi antibodies - ANA - Rheumatoid factor - POCT SEDIMENTATION RATE  10. Need for Tdap vaccination - Tdap vaccine greater than or equal to 7yo IM

## 2014-01-22 NOTE — Patient Instructions (Signed)
I will contact you with your lab results as soon as they are available.   If you have not heard from me in 2 weeks, please contact me.  The fastest way to get your results is to register for My Chart (see the instructions on the last page of this printout).  Keeping You Healthy  Get These Tests 1. Blood Pressure- Have your blood pressure checked once a year by your health care provider.  Normal blood pressure is 120/80. 2. Weight- Have your body mass index (BMI) calculated to screen for obesity.  BMI is measure of body fat based on height and weight.  You can also calculate your own BMI at www.nhlbisupport.com/bmi/. 3. Cholesterol- Have your cholesterol checked every 5 years starting at age 20 then yearly starting at age 45. 4. Chlamydia, HIV, and other sexually transmitted diseases- Get screened every year until age 25, then within three months of each new sexual provider. 5. Pap Smear- Every 1-3 years; discuss with your health care provider. 6. Mammogram- Every year starting at age 40  Take these medicines  Calcium with Vitamin D-Your body needs 1200 mg of Calcium each day and 800-1000 IU of Vitamin D daily.  Your body can only absorb 500 mg of Calcium at a time so Calcium must be taken in 2 or 3 divided doses throughout the day.  Multivitamin with folic acid- Once daily if it is possible for you to become pregnant.  Get these Immunizations  Gardasil-Series of three doses; prevents HPV related illness such as genital warts and cervical cancer.  Menactra-Single dose; prevents meningitis.  Tetanus shot- Every 10 years.  Flu shot-Every year.  Take these steps 1. Do not smoke-Your healthcare provider can help you quit.  For tips on how to quit go to www.smokefree.gov or call 1-800 QUITNOW. 2. Be physically active- Exercise 5 days a week for at least 30 minutes.  If you are not already physically active, start slow and gradually work up to 30 minutes of moderate physical activity.   Examples of moderate activity include walking briskly, dancing, swimming, bicycling, etc. 3. Breast Cancer- A self breast exam every month is important for early detection of breast cancer.  For more information and instruction on self breast exams, ask your healthcare provider or www.womenshealth.gov/faq/breast-self-exam.cfm. 4. Eat a healthy diet- Eat a variety of healthy foods such as fruits, vegetables, whole grains, low fat milk, low fat cheeses, yogurt, lean meats, poultry and fish, beans, nuts, tofu, etc.  For more information go to www. Thenutritionsource.org 5. Drink alcohol in moderation- Limit alcohol intake to one drink or less per day. Never drink and drive. 6. Depression- Your emotional health is as important as your physical health.  If you're feeling down or losing interest in things you normally enjoy please talk to your healthcare provider about being screened for depression. 7. Dental visit- Brush and floss your teeth twice daily; visit your dentist twice a year. 8. Eye doctor- Get an eye exam at least every 2 years. 9. Helmet use- Always wear a helmet when riding a bicycle, motorcycle, rollerblading or skateboarding. 10. Safe sex- If you may be exposed to sexually transmitted infections, use a condom. 11. Seat belts- Seat belts can save your live; always wear one. 12. Smoke/Carbon Monoxide detectors- These detectors need to be installed on the appropriate level of your home. Replace batteries at least once a year. 13. Skin cancer- When out in the sun please cover up and use sunscreen 15 SPF or higher.   14. Violence- If anyone is threatening or hurting you, please tell your healthcare provider.        

## 2014-01-23 LAB — ANA: ANA: NEGATIVE

## 2014-01-23 LAB — PAP IG AND HPV HIGH-RISK: HPV DNA High Risk: DETECTED — AB

## 2014-01-23 LAB — B. BURGDORFI ANTIBODIES: B BURGDORFERI AB IGG+ IGM: 0.45 {ISR}

## 2014-01-23 LAB — TSH: TSH: 2.398 u[IU]/mL (ref 0.350–4.500)

## 2014-01-23 NOTE — Progress Notes (Signed)
I have examined this patient along with the student and agree. ESR is normal, at 12.

## 2014-01-27 ENCOUNTER — Ambulatory Visit
Admission: RE | Admit: 2014-01-27 | Discharge: 2014-01-27 | Disposition: A | Discharge status: Home / Self Care | Payer: 59 | Source: Ambulatory Visit | Attending: Physician Assistant | Admitting: Physician Assistant

## 2014-01-27 DIAGNOSIS — IMO0002 Reserved for concepts with insufficient information to code with codable children: Secondary | ICD-10-CM

## 2014-02-04 NOTE — Addendum Note (Signed)
Addended by: Fernande BrasJEFFERY, [place] Delio S on: 02/04/2014 08:02 PM   Modules accepted: Orders

## 2014-03-02 ENCOUNTER — Other Ambulatory Visit: Payer: Self-pay | Admitting: Physician Assistant

## 2014-04-14 ENCOUNTER — Other Ambulatory Visit: Payer: Self-pay | Admitting: Physician Assistant

## 2014-09-09 ENCOUNTER — Other Ambulatory Visit: Payer: Self-pay | Admitting: Physician Assistant

## 2014-09-10 ENCOUNTER — Other Ambulatory Visit: Payer: Self-pay | Admitting: Physician Assistant

## 2014-10-26 ENCOUNTER — Encounter: Payer: Self-pay | Admitting: Physician Assistant

## 2014-12-02 ENCOUNTER — Telehealth: Payer: Self-pay | Admitting: Physician Assistant

## 2014-12-03 NOTE — Telephone Encounter (Signed)
Patient requesting a refill on medications "Sertraline" and "Lisinopril" stated she is completely out. Requesting it to be sent to [facility] on [address].  Patients call back number is [phone].

## 2014-12-03 NOTE — Telephone Encounter (Signed)
Sent in #30 per protocol- pt advised she needs a visit for further refills

## 2014-12-04 ENCOUNTER — Telehealth: Payer: Self-pay

## 2014-12-04 NOTE — Telephone Encounter (Signed)
Patient left voicemail on Thursday at 3:36pm stating [facility]. Also needs to speak with Chelle about rx refills. Cb# [phone].

## 2014-12-07 NOTE — Telephone Encounter (Signed)
Last office notes and labs (CPE from Jan 2015) [facility].

## 2014-12-28 ENCOUNTER — Other Ambulatory Visit: Payer: Self-pay | Admitting: Physician Assistant

## 2014-12-30 ENCOUNTER — Telehealth: Payer: Self-pay

## 2014-12-30 NOTE — Telephone Encounter (Signed)
Azzie called to request last ov notes on patient be sent to [phone] fax number.   Unable to reach patient "mailbox full" to let her know a release is needed.    [facility] at  [facility] [phone]  Ext [phone] informed them patient could not be reached and request a signed written release of information.

## 2015-01-01 NOTE — Telephone Encounter (Signed)
Patient called today checking status of records request. I informed her that we already sent records from Jan 22, 2014, including ov notes, labs, pap, and ultrasound results. She will call them to see if they need anything else.

## 2015-01-09 ENCOUNTER — Ambulatory Visit (INDEPENDENT_AMBULATORY_CARE_PROVIDER_SITE_OTHER): Payer: 59 | Admitting: Physician Assistant

## 2015-01-09 VITALS — BP 132/86 | HR 73 | Temp 98.0°F | Resp 18 | Ht 59.0 in | Wt 161.6 lb

## 2015-01-09 DIAGNOSIS — F419 Anxiety disorder, unspecified: Principal | ICD-10-CM

## 2015-01-09 DIAGNOSIS — R05 Cough: Secondary | ICD-10-CM

## 2015-01-09 DIAGNOSIS — F418 Other specified anxiety disorders: Secondary | ICD-10-CM

## 2015-01-09 DIAGNOSIS — Z91048 Other nonmedicinal substance allergy status: Secondary | ICD-10-CM

## 2015-01-09 DIAGNOSIS — F329 Major depressive disorder, single episode, unspecified: Secondary | ICD-10-CM

## 2015-01-09 DIAGNOSIS — Z9109 Other allergy status, other than to drugs and biological substances: Secondary | ICD-10-CM

## 2015-01-09 DIAGNOSIS — R059 Cough, unspecified: Secondary | ICD-10-CM

## 2015-01-09 MED ORDER — SERTRALINE HCL 100 MG PO TABS: 150.0000 mg | ORAL_TABLET | Freq: Every day | ORAL | Status: DC

## 2015-01-09 MED ORDER — GUAIFENESIN ER 1200 MG PO TB12: 1.0000 | ORAL_TABLET | Freq: Two times a day (BID) | ORAL | Status: AC

## 2015-01-09 MED ORDER — LISINOPRIL 5 MG PO TABS: 5.0000 mg | ORAL_TABLET | Freq: Every day | ORAL | Status: DC

## 2015-01-09 MED ORDER — TRIAMCINOLONE ACETONIDE 55 MCG/ACT NA AERO: 2.0000 | INHALATION_SPRAY | Freq: Every day | NASAL | Status: DC

## 2015-01-09 MED ORDER — BUPROPION HCL ER (XL) 300 MG PO TB24: 300.0000 mg | ORAL_TABLET | ORAL | Status: DC

## 2015-01-09 NOTE — Progress Notes (Signed)
Subjective:    Patient ID: [name], female    DOB: 1976/06/30, 39 y.o.   MRN: [id number]  HPI Pt presents to clinic for medication refill.  She feels like her anxiety is not as controlled as she would like it to be.  They recently moved and that was stressful - her children both had lice.  She feels like her irritability is higher.  She feels like of the 2 medications she is on the Zoloft has helped her anxiety the most.  She sleeps well.  She feels like when her anxiety is worse her depression gets worse and she does not want that to happen.  She has no problems with her BP medications.    She is also having a cough over the last 3 weeks - she has done nothing for it.  She has seasonal allergies and has stopped taking her allergy medication and doe not take the Flonase because it gives her nose bleeds.  Her cough is dry without much nasal congestion but she feels like there is mucus in her chest that will not come up.  The cough is coming from her chest.  She has had no other cold symptoms and does not feel sick.   Patient Active Problem List   Diagnosis Date Noted  . HTN (hypertension) 08/20/2012  . Anxiety and depression   . OVERWEIGHT 10/10/2007   Prior to Admission medications   Medication Sig Start Date End Date Taking? Authorizing Provider  buPROPion (WELLBUTRIN XL) 300 MG 24 hr tablet Take 1 tablet (300 mg total) by mouth every morning. 01/22/14  Yes Chelle S Jeffery, PA-C  lisinopril (PRINIVIL,ZESTRIL) 5 MG tablet Take 1 tablet (5 mg total) by mouth daily. NO MORE REFILLS WITHOUT OFFICE VISIT - 2ND NOTICE 12/28/14  Yes Chelle S Jeffery, PA-C  sertraline (ZOLOFT) 100 MG tablet Take 1 tablet (100 mg total) by mouth daily. NO MORE REFILLS WITHOUT OFFICE VISIT - 2ND NOTICE 12/28/14  Yes Chelle Tessa Lerner, PA-C   Allergies  Allergen Reactions  . Amoxicillin Nausea Only    Medications, allergies, past medical history, surgical history, family history, social history and problem  list reviewed and updated.   Review of Systems  Constitutional: Negative for fever and chills.  HENT: Negative for congestion, rhinorrhea and sinus pressure.   Respiratory: Positive for cough. Negative for shortness of breath and wheezing.   Allergic/Immunologic: Positive for environmental allergies.  Psychiatric/Behavioral: The patient is nervous/anxious.        Objective:   Physical Exam  Constitutional: She is oriented to person, place, and time. She appears well-developed and well-nourished.  BP 132/86 mmHg  Pulse 73  Temp(Src) 98 F (36.7 C) (Oral)  Resp 18  Ht  (1.499 m)  Wt 161 lb 9.6 oz (73.301 kg)  BMI 32.62 kg/m2  SpO2 97%  LMP 12/17/2014   HENT:  Head: Normocephalic and atraumatic.  Right Ear: Hearing, tympanic membrane, external ear and ear canal normal.  Left Ear: Hearing, tympanic membrane, external ear and ear canal normal.  Nose: Mucosal edema (pale ) present.  Mouth/Throat: Uvula is midline, oropharynx is clear and moist and mucous membranes are normal.  Eyes: Conjunctivae are normal.  Neck: Normal range of motion.  Cardiovascular: Normal rate, regular rhythm and normal heart sounds.   No murmur heard. Pulmonary/Chest: Effort normal and breath sounds normal. She has no wheezes.  Neurological: She is alert and oriented to person, place, and time.  Skin: Skin is warm and  dry.  Psychiatric: She has a normal mood and affect. Her behavior is normal. Judgment and thought content normal.       Assessment & Plan:  Anxiety and depression - We are going to increase her zoloft to 150mg  to see if we can get her anxiety under control.  She will recheck in 6 months unless she is having more problems with her anxiety.  Plan: sertraline (ZOLOFT) 100 MG tablet, buPROPion (WELLBUTRIN XL) 300 MG 24 hr tablet  Cough - I think related to her allergies we are going to get her allergies under control and treat her with mucinex and if she is not improving she will call  for an abx to cover atypical bacteria due to the time of the cough. Plan: Guaifenesin (MUCINEX MAXIMUM STRENGTH) 1200 MG TB12  Environmental allergies - Currently untreated. She is going to start oral otc agents and then if that does not improve her allergy symptoms she will try Nasacort which is water based in hope that she will not have nose bleeds.  Plan: triamcinolone (NASACORT AQ) 55 MCG/ACT AERO nasal inhaler  HTN - medication refilled   Benny LennertSarah Weber PA-C  [facility] 01/09/2015 11:51 AM

## 2015-08-25 ENCOUNTER — Other Ambulatory Visit: Payer: Self-pay | Admitting: Physician Assistant

## 2015-09-06 ENCOUNTER — Other Ambulatory Visit: Payer: Self-pay | Admitting: Physician Assistant

## 2015-10-03 ENCOUNTER — Other Ambulatory Visit: Payer: Self-pay | Admitting: Physician Assistant

## 2015-10-16 ENCOUNTER — Telehealth: Payer: Self-pay | Admitting: Radiology

## 2015-10-16 ENCOUNTER — Ambulatory Visit (INDEPENDENT_AMBULATORY_CARE_PROVIDER_SITE_OTHER): Payer: 59 | Admitting: Emergency Medicine

## 2015-10-16 VITALS — BP 108/64 | HR 82 | Temp 98.0°F | Resp 18 | Ht 61.0 in | Wt 167.0 lb

## 2015-10-16 DIAGNOSIS — F419 Anxiety disorder, unspecified: Principal | ICD-10-CM

## 2015-10-16 DIAGNOSIS — G4733 Obstructive sleep apnea (adult) (pediatric): Secondary | ICD-10-CM

## 2015-10-16 DIAGNOSIS — Z23 Encounter for immunization: Secondary | ICD-10-CM

## 2015-10-16 DIAGNOSIS — E669 Obesity, unspecified: Secondary | ICD-10-CM | POA: Diagnosis not present

## 2015-10-16 DIAGNOSIS — F329 Major depressive disorder, single episode, unspecified: Secondary | ICD-10-CM

## 2015-10-16 DIAGNOSIS — F418 Other specified anxiety disorders: Secondary | ICD-10-CM | POA: Diagnosis not present

## 2015-10-16 MED ORDER — BUPROPION HCL ER (XL) 150 MG PO TB24: ORAL_TABLET | ORAL | Status: DC

## 2015-10-16 MED ORDER — SERTRALINE HCL 100 MG PO TABS: ORAL_TABLET | ORAL | Status: DC

## 2015-10-16 NOTE — Telephone Encounter (Signed)
Patient returned my call, she left today before we could draw blood for her labs. She will come back for lab only. Dr Dareen PianoAnderson has put in future orders.

## 2015-10-16 NOTE — Patient Instructions (Signed)
Generalized Anxiety Disorder Generalized anxiety disorder (GAD) is a mental disorder. It interferes with life functions, including relationships, work, and school. GAD is different from normal anxiety, which everyone experiences at some point in their lives in response to specific life events and activities. Normal anxiety actually helps us prepare for and get through these life events and activities. Normal anxiety goes away after the event or activity is over.  GAD causes anxiety that is not necessarily related to specific events or activities. It also causes excess anxiety in proportion to specific events or activities. The anxiety associated with GAD is also difficult to control. GAD can vary from mild to severe. People with severe GAD can have intense waves of anxiety with physical symptoms (panic attacks).  SYMPTOMS The anxiety and worry associated with GAD are difficult to control. This anxiety and worry are related to many life events and activities and also occur more days than not for 6 months or longer. People with GAD also have three or more of the following symptoms (one or more in children):  Restlessness.   Fatigue.  Difficulty concentrating.   Irritability.  Muscle tension.  Difficulty sleeping or unsatisfying sleep. DIAGNOSIS GAD is diagnosed through an assessment by your health care provider. Your health care provider will ask you questions aboutyour mood,physical symptoms, and events in your life. Your health care provider may ask you about your medical history and use of alcohol or drugs, including prescription medicines. Your health care provider may also do a physical exam and blood tests. Certain medical conditions and the use of certain substances can cause symptoms similar to those associated with GAD. Your health care provider may refer you to a mental health specialist for further evaluation. TREATMENT The following therapies are usually used to treat GAD:    Medication. Antidepressant medication usually is prescribed for long-term daily control. Antianxiety medicines may be added in severe cases, especially when panic attacks occur.   Talk therapy (psychotherapy). Certain types of talk therapy can be helpful in treating GAD by providing support, education, and guidance. A form of talk therapy called cognitive behavioral therapy can teach you healthy ways to think about and react to daily life events and activities.  Stress managementtechniques. These include yoga, meditation, and exercise and can be very helpful when they are practiced regularly. A mental health specialist can help determine which treatment is best for you. Some people see improvement with one therapy. However, other people require a combination of therapies.   This information is not intended to replace advice given to you by your health care provider. Make sure you discuss any questions you have with your health care provider.   Document Released: 04/07/2013 Document Revised: 01/01/2015 Document Reviewed: 04/07/2013 Elsevier Interactive Patient Education 2016 Elsevier Inc.  

## 2015-10-16 NOTE — Addendum Note (Signed)
Addended byCaffie Damme: LITTRELL, AMY W on: 10/16/2015 11:51 AM   Modules accepted: Orders

## 2015-10-16 NOTE — Progress Notes (Signed)
Patient left before her labs were done/ I have called her to see if she wants to return today for this, or come back for lab only. Left message for her to call me back

## 2015-10-16 NOTE — Addendum Note (Signed)
Addended byCaffie Damme: LITTRELL, AMY W on: 10/16/2015 02:38 PM   Modules accepted: Orders

## 2015-10-16 NOTE — Progress Notes (Signed)
Subjective:  Patient ID: [name], female    DOB: 1976-03-25  Age: 39 y.o. MRN: [id number]  CC: Medication Refill and Flu Vaccine   HPI [name] presents  patient has come in for renewal of her medication. She said she has a history of hypertension her blood pressure today is 108/64. She is on lisinopril 5 mg and wishes to discontinue the medication. She said that her mental health issues of been improved markedly with her children going to school and she wants to tach off on her medication. She denies any suicidal or homicidal thoughts denies any significant depressive symptoms. He sleeping well and eating well she is concerned about her weight  History [name] has a past medical history of Anxiety and depression; Abnormal Pap smear of cervix (10/2007); Post partum depression; and HTN complicating peripregnancy, antepartum.   She has past surgical history that includes Cesarean section.   Her  family history includes Hypertension in her maternal grandmother; Mental illness in her mother.  She   reports that she has quit smoking. She has never used smokeless tobacco. She reports that she does not drink alcohol or use illicit drugs.  Outpatient Prescriptions Prior to Visit  Medication Sig Dispense Refill  . lisinopril (PRINIVIL,ZESTRIL) 5 MG tablet TAKE 1 TABLET (5 MG TOTAL) BY MOUTH DAILY. "OV NEEDED FOR ADDITIONAL REFILLS" 30 tablet 0  . buPROPion (WELLBUTRIN XL) 300 MG 24 hr tablet TAKE 1 TABLET BY MOUTH EVERY MORNING  "NO MORE REFILLS WITHOUT OFFICE VISIT" 15 tablet 0  . sertraline (ZOLOFT) 100 MG tablet TAKE 1 AND 1/2 TABLETS BY MOUTH EVERY DAY.  "OV NEEDED FOR REFILLS" 45 tablet 0  . triamcinolone (NASACORT AQ) 55 MCG/ACT AERO nasal inhaler Place 2 sprays into the nose daily. (Patient not taking: Reported on 10/16/2015) 1 Inhaler 0   No facility-administered medications prior to visit.    Social History   Social History  . Marital Status: Married    Spouse  Name: [name]  . Number of Children: 2  . Years of Education: 19   Occupational History  . homemaker   . 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 Smoking status: Former Games developer  . Smokeless tobacco: Never Used  . Alcohol Use: No  . Drug Use: No     Comment: previous marijuana to self-treat anxiety  . Sexual Activity:    Partners: Male    Pharmacist, hospital Protection: Surgical   Other Topics Concern  . None   Social History Narrative   Step-father adopted this patient and raised her.   Lives with her husband and their two daughters.     Review of Systems  Constitutional: Negative for fever, chills and appetite change.  HENT: Negative for congestion, ear pain, postnasal drip, sinus pressure and sore throat.   Eyes: Negative for pain and redness.  Respiratory: Negative for cough, shortness of breath and wheezing.   Cardiovascular: Negative for leg swelling.  Gastrointestinal: Negative for nausea, vomiting, abdominal pain, diarrhea, constipation and blood in stool.  Endocrine: Negative for polyuria.  Genitourinary: Negative for dysuria, urgency, frequency and flank pain.  Musculoskeletal: Negative for gait problem.  Skin: Negative for rash.  Neurological: Negative for weakness and headaches.  Psychiatric/Behavioral: Negative for confusion and decreased concentration. The patient is not nervous/anxious.     Objective:  BP 108/64 mmHg  Pulse 82  Temp(Src) 98 F (36.7 C)  Resp 18  Ht 5'  1" (1.549 m)  Wt 167 lb (75.751 kg)  BMI 31.57 kg/m2  SpO2 99%  LMP 09/16/2015 (Approximate)  Physical Exam  Constitutional: She is oriented to person, place, and time. She appears well-developed and well-nourished. No distress.  HENT:  Head: Normocephalic and atraumatic.  Right Ear: External ear normal.  Left Ear: External ear normal.  Nose: Nose normal.  Eyes: Conjunctivae and EOM are normal. Pupils are equal, round, and reactive to light.  No scleral icterus.  Neck: Normal range of motion. Neck supple. No tracheal deviation present.  Cardiovascular: Normal rate, regular rhythm and normal heart sounds.   Pulmonary/Chest: Effort normal. No respiratory distress. She has no wheezes. She has no rales.  Abdominal: She exhibits no mass. There is no tenderness. There is no rebound and no guarding.  Musculoskeletal: She exhibits no edema.  Lymphadenopathy:    She has no cervical adenopathy.  Neurological: She is alert and oriented to person, place, and time. Coordination normal.  Skin: Skin is warm and dry. No rash noted.  Psychiatric: She has a normal mood and affect. Her behavior is normal.      Assessment & Plan:   [name] was seen today for medication refill and flu vaccine.  Diagnoses and all orders for this visit:  Anxiety and depression -     CBC -     Comprehensive metabolic panel -     Lipid panel  Obesity -     CBC -     Comprehensive metabolic panel -     Lipid panel  Obstructive sleep apnea -     Ambulatory referral to Neurology -     CBC -     Comprehensive metabolic panel -     Lipid panel  Needs flu shot -     Flu Vaccine QUAD 36+ mos IM  Other orders -     sertraline (ZOLOFT) 100 MG tablet; TAKE 1 AND 1/2 TABLETS BY MOUTH EVERY DAY. -     buPROPion (WELLBUTRIN XL) 150 MG 24 hr tablet; TAKE 1 TABLET BY MOUTH EVERY MORNING   I have changed Ms. Jerger's sertraline and buPROPion. I am also having her maintain her triamcinolone and lisinopril.  Meds ordered this encounter  Medications  . sertraline (ZOLOFT) 100 MG tablet    Sig: TAKE 1 AND 1/2 TABLETS BY MOUTH EVERY DAY.    Dispense:  45 tablet    Refill:  5  . buPROPion (WELLBUTRIN XL) 150 MG 24 hr tablet    Sig: TAKE 1 TABLET BY MOUTH EVERY MORNING    Dispense:  30 tablet    Refill:  5    Appropriate red flag conditions were discussed with the patient as well as actions that should be taken.  Patient expressed his  understanding.  Follow-up: Return in about 3 months (around 01/16/2016).  Carmelina DaneAnderson, Fiana Gladu S, MD

## 2015-11-03 ENCOUNTER — Institutional Professional Consult (permissible substitution): Payer: 59 | Admitting: Neurology

## 2016-06-20 ENCOUNTER — Other Ambulatory Visit: Payer: Self-pay

## 2016-06-20 ENCOUNTER — Telehealth: Payer: Self-pay

## 2016-06-20 MED ORDER — BUPROPION HCL ER (XL) 150 MG PO TB24: ORAL_TABLET | ORAL | Status: AC

## 2016-06-20 NOTE — Telephone Encounter (Signed)
Pt is needing refill on welbutrin  Best number [phone]

## 2016-06-22 NOTE — Telephone Encounter (Signed)
This was sent in on 6/27. Please make sure pt is aware.

## 2016-06-28 NOTE — Telephone Encounter (Signed)
LMOM for pt to advise we had sent in Rx RF in case her pharm did not notify her when ready.

## 2016-08-18 ENCOUNTER — Other Ambulatory Visit: Payer: Self-pay

## 2016-08-18 MED ORDER — SERTRALINE HCL 100 MG PO TABS: ORAL_TABLET | ORAL | 0 refills | Status: DC

## 2016-09-15 ENCOUNTER — Other Ambulatory Visit: Payer: Self-pay | Admitting: Urgent Care

## 2017-01-23 ENCOUNTER — Other Ambulatory Visit: Payer: Self-pay | Admitting: Urgent Care

## 2017-01-23 NOTE — Telephone Encounter (Signed)
Patient has not been to our clinic in more than one year. Last office visit was with Dr. Dareen PianoAnderson in 2016. She was supposed to be notified that no more refills would be given without an office visit in 07/2016. I personally called patient and left a voice message for her to rtc for a refill.

## 2017-11-26 ENCOUNTER — Encounter (HOSPITAL_COMMUNITY): Payer: Self-pay

## 2017-11-26 ENCOUNTER — Emergency Department (HOSPITAL_COMMUNITY)
Admission: EM | Admit: 2017-11-26 | Discharge: 2017-11-26 | Disposition: A | Discharge status: Home / Self Care | Payer: 59 | Attending: Emergency Medicine | Admitting: Emergency Medicine

## 2017-11-26 ENCOUNTER — Other Ambulatory Visit: Payer: Self-pay

## 2017-11-26 DIAGNOSIS — Z87891 Personal history of nicotine dependence: Secondary | ICD-10-CM | POA: Diagnosis not present

## 2017-11-26 DIAGNOSIS — E876 Hypokalemia: Secondary | ICD-10-CM | POA: Insufficient documentation

## 2017-11-26 DIAGNOSIS — R9431 Abnormal electrocardiogram [ECG] [EKG]: Secondary | ICD-10-CM | POA: Diagnosis not present

## 2017-11-26 DIAGNOSIS — Z79899 Other long term (current) drug therapy: Secondary | ICD-10-CM | POA: Diagnosis not present

## 2017-11-26 DIAGNOSIS — R55 Syncope and collapse: Secondary | ICD-10-CM

## 2017-11-26 LAB — BASIC METABOLIC PANEL
Anion gap: 10 (ref 5–15)
BUN: 13 mg/dL (ref 6–20)
CALCIUM: 9.1 mg/dL (ref 8.9–10.3)
CHLORIDE: 102 mmol/L (ref 101–111)
CO2: 26 mmol/L (ref 22–32)
CREATININE: 0.64 mg/dL (ref 0.44–1.00)
GFR calc non Af Amer: >60 mL/min (ref >60)
Glucose, Bld: 126 mg/dL — ABNORMAL HIGH (ref 65–99)
Potassium: 3.2 mmol/L — ABNORMAL LOW (ref 3.5–5.1)
SODIUM: 138 mmol/L (ref 135–145)

## 2017-11-26 LAB — URINALYSIS, ROUTINE W REFLEX MICROSCOPIC
Bilirubin Urine: NEGATIVE
Glucose, UA: NEGATIVE mg/dL
Hgb urine dipstick: NEGATIVE
Ketones, ur: NEGATIVE mg/dL
LEUKOCYTES UA: NEGATIVE
Nitrite: NEGATIVE
PROTEIN: NEGATIVE mg/dL
Specific Gravity, Urine: 1.009 (ref 1.005–1.030)
pH: 6 (ref 5.0–8.0)

## 2017-11-26 LAB — CBC
HCT: 39.8 % (ref 36.0–46.0)
Hemoglobin: 13.6 g/dL (ref 12.0–15.0)
MCH: 30.4 pg (ref 26.0–34.0)
MCHC: 34.2 g/dL (ref 30.0–36.0)
MCV: 88.8 fL (ref 78.0–100.0)
PLATELETS: 251 10*3/uL (ref 150–400)
RBC: 4.48 MIL/uL (ref 3.87–5.11)
RDW: 12.2 % (ref 11.5–15.5)
WBC: 13 10*3/uL — AB (ref 4.0–10.5)

## 2017-11-26 LAB — I-STAT TROPONIN, ED: TROPONIN I, POC: 0 ng/mL (ref 0.00–0.08)

## 2017-11-26 LAB — MAGNESIUM: Magnesium: 2.1 mg/dL (ref 1.7–2.4)

## 2017-11-26 LAB — I-STAT BETA HCG BLOOD, ED (MC, WL, AP ONLY)

## 2017-11-26 LAB — D-DIMER, QUANTITATIVE: D-Dimer, Quant: <0.27 ug/mL-FEU (ref 0.00–0.50)

## 2017-11-26 MED ORDER — POTASSIUM CHLORIDE CRYS ER 20 MEQ PO TBCR
40.0000 meq | EXTENDED_RELEASE_TABLET | Freq: Once | ORAL | Status: AC
  Administered 2017-11-26: 40 meq via ORAL
  Filled 2017-11-26 (×2): qty 2

## 2017-11-26 NOTE — ED Provider Notes (Signed)
[facility]-EMERGENCY DEPT Provider Note   CSN: [id number] Arrival date & time: 11/26/17  1201     History   Chief Complaint Chief Complaint  Patient presents with  . Loss of Consciousness    HPI [name] is a 41 y.o. female.  HPI  [name] is a 41 y.o. female with history of hypertension, depression, presents to emergency department with a syncopal episode.  Patient states she was watching television last night, states she got up and started feeling dizzy.  She then woke up on the floor.  Syncopal episode was witnessed by her husband.  He states that she was unconscious with her eyes open.  When she woke up she was alert and oriented.  She denied any complaints.  She states she felt well and did not go to emergency department last night.  She denies any symptoms today, but became anxious, states "I have kids I need to make sure I am okay."  She denies any recent surgery in the travels.  She denies any chest pain or cardiac family history.  No new medications, except for Cipro which she is currently on for UTI.  No urinary symptoms at this time.   Past Medical History:  Diagnosis Date  . Abnormal Pap smear of cervix 10/2007   colposcopy 12/2007  . Anxiety and depression    self-harm 07/2012  . HTN complicating peripregnancy, antepartum   . Post partum depression     Patient Active Problem List   Diagnosis Date Noted  . Anxiety and depression   . Obesity 10/10/2007    Past Surgical History:  Procedure Laterality Date  . CESAREAN SECTION      OB History    Gravida Para Term Preterm AB Living   2 2 2  0 0 2   SAB TAB Ectopic Multiple Live Births   0 0 0 0         Home Medications    Prior to Admission medications   Medication Sig Start Date End Date Taking? Authorizing Provider  acetaminophen (TYLENOL) 500 MG tablet Take 1,000 mg by mouth every 6 (six) hours as needed for moderate pain.   Yes [provider]    ciprofloxacin (CIPRO) 500 MG tablet Take 500 mg by mouth 2 (two) times daily.  11/20/17  Yes [provider]  losartan-hydrochlorothiazide (HYZAAR) 50-12.5 MG tablet Take 1 tablet by mouth daily.  11/01/17  Yes [provider]  buPROPion (WELLBUTRIN XL) 150 MG 24 hr tablet TAKE 1 TABLET BY MOUTH EVERY MORNING Patient not taking: Reported on 11/26/2017 06/20/16   Ethelda ChickSmith, Kristi M, MD  lisinopril (PRINIVIL,ZESTRIL) 5 MG tablet TAKE 1 TABLET (5 MG TOTAL) BY MOUTH DAILY. "OV NEEDED FOR ADDITIONAL REFILLS" Patient not taking: Reported on 11/26/2017 09/07/15   Valarie ConesWeber, Dema SeverinSarah L, PA-C  sertraline (ZOLOFT) 100 MG tablet TAKE 1 AND 1/2 TABLETS BY MOUTH EVERY DAY. Patient not taking: Reported on 11/26/2017 08/18/16   Wallis BambergMani, Mario, PA-C  triamcinolone (NASACORT AQ) 55 MCG/ACT AERO nasal inhaler Place 2 sprays into the nose daily. Patient not taking: Reported on 10/16/2015 01/09/15   Morrell RiddleWeber, Sarah L, PA-C    Family History Family History  Problem Relation Age of Onset  . Mental illness Mother        depression  . Hypertension Maternal Grandmother     Social History Social History   Tobacco Use  . Smoking status: Former Games developermoker  . Smokeless tobacco: Never Used  Substance Use Topics  .  Alcohol use: No  . Drug use: No    Comment: previous marijuana to self-treat anxiety     Allergies   Amoxicillin   Review of Systems Review of Systems  Constitutional: Negative for chills and fever.  Respiratory: Negative for cough, chest tightness and shortness of breath.   Cardiovascular: Negative for chest pain, palpitations and leg swelling.  Gastrointestinal: Negative for abdominal pain, diarrhea, nausea and vomiting.  Genitourinary: Negative for dysuria, flank pain, pelvic pain, vaginal bleeding, vaginal discharge and vaginal pain.  Musculoskeletal: Negative for arthralgias, myalgias, neck pain and neck stiffness.  Skin: Negative for rash.  Neurological: Positive for syncope. Negative for  dizziness, weakness and headaches.  All other systems reviewed and are negative.    Physical Exam Updated Vital Signs BP (!) 150/94 (BP Location: Left Arm)   Pulse 78   Temp 98.4 F (36.9 C) (Oral)   Resp 18   Ht 5' (1.524 m)   Wt 76.2 kg (168 lb)   LMP 10/27/2017   SpO2 100%   BMI 32.81 kg/m   Physical Exam  Constitutional: She is oriented to person, place, and time. She appears well-developed and well-nourished. No distress.  HENT:  Head: Normocephalic and atraumatic.  Eyes: Conjunctivae and EOM are normal. Pupils are equal, round, and reactive to light.  Neck: Normal range of motion. Neck supple.  Cardiovascular: Normal rate, regular rhythm and normal heart sounds.  No murmur heard. Pulmonary/Chest: Effort normal and breath sounds normal. No respiratory distress. She has no wheezes. She has no rales.  Abdominal: Soft. Bowel sounds are normal. She exhibits no distension. There is no tenderness. There is no rebound.  Musculoskeletal: She exhibits no edema.  Neurological: She is alert and oriented to person, place, and time.  5/5 and equal upper and lower extremity strength bilaterally. Equal grip strength bilaterally. Normal finger to nose and heel to shin. No pronator drift.   Skin: Skin is warm and dry.  Psychiatric: She has a normal mood and affect. Her behavior is normal.  Nursing note and vitals reviewed.    ED Treatments / Results  Labs (all labs ordered are listed, but only abnormal results are displayed) Labs Reviewed  BASIC METABOLIC PANEL - Abnormal; Notable for the following components:      Result Value   Potassium 3.2 (*)    Glucose, Bld 126 (*)    All other components within normal limits  CBC - Abnormal; Notable for the following components:   WBC 13.0 (*)    All other components within normal limits  URINALYSIS, ROUTINE W REFLEX MICROSCOPIC - Abnormal; Notable for the following components:   Color, Urine STRAW (*)    All other components within  normal limits  D-DIMER, QUANTITATIVE (NOT AT [facility])  MAGNESIUM  CBG MONITORING, ED  I-STAT BETA HCG BLOOD, ED ([facility], [facility], [facility] ONLY)  I-STAT TROPONIN, ED    EKG  EKG Interpretation None       Radiology No results found.  Procedures Procedures (including critical care time)  Medications Ordered in ED Medications  potassium chloride SA (K-DUR,KLOR-CON) CR tablet 40 mEq (not administered)     Initial Impression / Assessment and Plan / ED Course  I have reviewed the triage vital signs and the nursing notes.  Pertinent labs & imaging results that were available during my care of the patient were reviewed by me and considered in my medical decision making (see chart for details).     Patient with a syncopal episode yesterday.  Asymptomatic but worried.  Will check labs, orthostatics, EKG.   EKG with some new T wave inversion.  Will add troponin and d-dimer.  Again patient is now asymptomatic.  Will also add mag potassium is 3.2, 40 mEq given by mouth.  6:44 PM Not particularly orthostatic, magnesium is normal.  D-dimer negative.  Not pregnant.   she is in no acute distress.  Suspect most likely orthostatic hypotension which caused her to have a syncopal episode, patient admitted that she did not eat much or drink much water yesterday.  She is feeling much better today and is asymptomatic.  Given some new EKG changes, will have her follow-up with family doctor or cardiology.  Heart score is 2.  Low risk for ACS.  And at this time as patient is stable for discharge home.  Return precautions discussed.  Vitals:   11/26/17 1240 11/26/17 1527 11/26/17 1715  BP: (!) 144/107 (!) 150/94 (!) 122/96  Pulse: 76 78 76  Resp: 16 18 14   Temp: 98.2 F (36.8 C) 98.4 F (36.9 C)   TempSrc: Oral Oral   SpO2: 100% 100% 99%  Weight: 76.2 kg (168 lb)    Height: 5' (1.524 m)       Final Clinical Impressions(s) / ED Diagnoses   Final diagnoses:  Syncope and collapse  Hypokalemia  Abnormal  ECG    ED Discharge Orders    None       Jaynie CrumbleKirichenko, Brei Pociask, PA-C 11/26/17 1847    Tegeler, Canary Brimhristopher J, MD 11/27/17 1244

## 2017-11-26 NOTE — ED Triage Notes (Addendum)
Patient states she had a witnessed syncopal episode last night. Patient denies hitting her head. Patient tearful in triage. Patient states she is overwhelmed and sad because she is scared of the reason why she fainted because she has small children at home. Patient denies.  Patient also has been on Cipro for a UTI.

## 2017-11-26 NOTE — Discharge Instructions (Signed)
Make sure to eat potassium rich foods.  Drink plenty of fluids.  Follow-up with a family doctor cardiology for further testing and evaluation of your EKG findings.  Your workup here today showed no causes for your syncopal episode.  If symptoms are worsening please return back to emergency department.

## 2017-12-21 ENCOUNTER — Encounter (HOSPITAL_COMMUNITY): Payer: Self-pay | Admitting: Emergency Medicine

## 2017-12-21 ENCOUNTER — Ambulatory Visit (HOSPITAL_COMMUNITY)
Admission: EM | Admit: 2017-12-21 | Discharge: 2017-12-21 | Disposition: A | Discharge status: Home / Self Care | Payer: 59 | Attending: Internal Medicine | Admitting: Internal Medicine

## 2017-12-21 DIAGNOSIS — K2970 Gastritis, unspecified, without bleeding: Secondary | ICD-10-CM | POA: Diagnosis not present

## 2017-12-21 DIAGNOSIS — R1012 Left upper quadrant pain: Secondary | ICD-10-CM

## 2017-12-21 DIAGNOSIS — K2990 Gastroduodenitis, unspecified, without bleeding: Secondary | ICD-10-CM | POA: Diagnosis not present

## 2017-12-21 DIAGNOSIS — R350 Frequency of micturition: Secondary | ICD-10-CM

## 2017-12-21 LAB — POCT URINALYSIS DIP (DEVICE)
Bilirubin Urine: NEGATIVE
GLUCOSE, UA: NEGATIVE mg/dL
Hgb urine dipstick: NEGATIVE
Ketones, ur: NEGATIVE mg/dL
LEUKOCYTES UA: NEGATIVE
Nitrite: NEGATIVE
PROTEIN: NEGATIVE mg/dL
Specific Gravity, Urine: 1.01 (ref 1.005–1.030)
UROBILINOGEN UA: 0.2 mg/dL (ref 0.0–1.0)
pH: 5.5 (ref 5.0–8.0)

## 2017-12-21 LAB — GLUCOSE, CAPILLARY: GLUCOSE-CAPILLARY: 80 mg/dL (ref 65–99)

## 2017-12-21 MED ORDER — OMEPRAZOLE 20 MG PO CPDR: 40.0000 mg | DELAYED_RELEASE_CAPSULE | Freq: Every day | ORAL | 0 refills | Status: DC

## 2017-12-21 MED ORDER — PHENAZOPYRIDINE HCL 200 MG PO TABS: 200.0000 mg | ORAL_TABLET | Freq: Three times a day (TID) | ORAL | 0 refills | Status: DC

## 2017-12-21 NOTE — ED Triage Notes (Signed)
PT reports urinary urgency and bladder discomfort for 2 months. PT has taken cipro without relief.

## 2017-12-21 NOTE — ED Provider Notes (Signed)
[facility]    CSN: [id number] Arrival date & time: 12/21/17  1608     History   Chief Complaint Chief Complaint  Patient presents with  . Urinary Frequency    HPI Lucas Mallowmily D Papandrea is a 41 y.o. female.   41 year old female, with history of anxiety, depression, hypertension, presenting today with 2 separate complaints.  First complaint is of urinary frequency and suprapubic pressure.  She states that this is been ongoing intermittently for the past 2 months.  States that [facility] and had a normal urinalysis at that time but was treated with Cipro.  States that she did not have any improvement of her symptoms after finishing the antibiotic.  She denies any flank pain, fever, chills, nausea or vomiting. Patient is also complaining of left upper quadrant abdominal pain.  States that this is been present for the past 6 months.  Pain seems to worsen after eating.  She denies any associated nausea or vomiting.  Denies any reflux symptoms.   The history is provided by the patient.  Urinary Frequency  This is a recurrent problem. The current episode started more than 1 week ago. The problem occurs constantly. The problem has not changed since onset.Associated symptoms include abdominal pain. Pertinent negatives include no chest pain, no headaches and no shortness of breath. The symptoms are aggravated by eating. Nothing relieves the symptoms. She has tried nothing for the symptoms. The treatment provided no relief.    Past Medical History:  Diagnosis Date  . Abnormal Pap smear of cervix 10/2007   colposcopy 12/2007  . Anxiety and depression    self-harm 07/2012  . HTN complicating peripregnancy, antepartum   . Post partum depression     Patient Active Problem List   Diagnosis Date Noted  . Anxiety and depression   . Obesity 10/10/2007    Past Surgical History:  Procedure Laterality Date  . CESAREAN SECTION      OB History    Gravida Para Term  Preterm AB Living   2 2 2  0 0 2   SAB TAB Ectopic Multiple Live Births   0 0 0 0         Home Medications    Prior to Admission medications   Medication Sig Start Date End Date Taking? Authorizing Provider  losartan-hydrochlorothiazide (HYZAAR) 50-12.5 MG tablet Take 1 tablet by mouth daily.  11/01/17  Yes [provider]  acetaminophen (TYLENOL) 500 MG tablet Take 1,000 mg by mouth every 6 (six) hours as needed for moderate pain.    [provider]  buPROPion (WELLBUTRIN XL) 150 MG 24 hr tablet TAKE 1 TABLET BY MOUTH EVERY MORNING Patient not taking: Reported on 11/26/2017 06/20/16   Ethelda ChickSmith, Kristi M, MD  ciprofloxacin (CIPRO) 500 MG tablet Take 500 mg by mouth 2 (two) times daily.  11/20/17   [provider]  lisinopril (PRINIVIL,ZESTRIL) 5 MG tablet TAKE 1 TABLET (5 MG TOTAL) BY MOUTH DAILY. "OV NEEDED FOR ADDITIONAL REFILLS" Patient not taking: Reported on 11/26/2017 09/07/15   Valarie ConesWeber, Dema SeverinSarah L, PA-C  omeprazole (PRILOSEC) 20 MG capsule Take 2 capsules (40 mg total) by mouth daily. 12/21/17 01/20/18  Blue, Olivia C, PA-C  phenazopyridine (PYRIDIUM) 200 MG tablet Take 1 tablet (200 mg total) by mouth 3 (three) times daily. 12/21/17   Blue, Olivia C, PA-C  sertraline (ZOLOFT) 100 MG tablet TAKE 1 AND 1/2 TABLETS BY MOUTH EVERY DAY. Patient not taking: Reported on 11/26/2017 08/18/16  Wallis Bamberg, PA-C  triamcinolone (NASACORT AQ) 55 MCG/ACT AERO nasal inhaler Place 2 sprays into the nose daily. Patient not taking: Reported on 10/16/2015 01/09/15   Morrell Riddle, PA-C    Family History Family History  Problem Relation Age of Onset  . Mental illness Mother        depression  . Hypertension Maternal Grandmother     Social History Social History   Tobacco Use  . Smoking status: Former Games developer  . Smokeless tobacco: Never Used  Substance Use Topics  . Alcohol use: Yes    Comment: occ.   . Drug use: No    Comment: previous marijuana to self-treat anxiety      Allergies   Amoxicillin   Review of Systems Review of Systems  Constitutional: Negative for chills and fever.  HENT: Negative for ear pain and sore throat.   Eyes: Negative for pain and visual disturbance.  Respiratory: Negative for cough and shortness of breath.   Cardiovascular: Negative for chest pain and palpitations.  Gastrointestinal: Positive for abdominal pain. Negative for vomiting.  Genitourinary: Positive for frequency and urgency. Negative for dysuria and hematuria.  Musculoskeletal: Negative for arthralgias and back pain.  Skin: Negative for color change and rash.  Neurological: Negative for seizures, syncope and headaches.  All other systems reviewed and are negative.    Physical Exam Triage Vital Signs ED Triage Vitals  Enc Vitals Group     BP 12/21/17 1632 (!) 159/115     Pulse Rate 12/21/17 1632 78     Resp 12/21/17 1632 16     Temp 12/21/17 1632 99 F (37.2 C)     Temp Source 12/21/17 1632 Oral     SpO2 12/21/17 1632 100 %     Weight 12/21/17 1632 158 lb (71.7 kg)     Height 12/21/17 1632 5' (1.524 m)     Head Circumference --      Peak Flow --      Pain Score 12/21/17 1633 6     Pain Loc --      Pain Edu? --      Excl. in GC? --    No data found.  Updated Vital Signs BP (!) 159/115   Pulse 78   Temp 99 F (37.2 C) (Oral)   Resp 16   Ht 5' (1.524 m)   Wt 158 lb (71.7 kg)   LMP 11/26/2017   SpO2 100%   BMI 30.86 kg/m   Visual Acuity Right Eye Distance:   Left Eye Distance:   Bilateral Distance:    Right Eye Near:   Left Eye Near:    Bilateral Near:     Physical Exam  Constitutional: She appears well-developed and well-nourished. No distress.  HENT:  Head: Normocephalic and atraumatic.  Eyes: Conjunctivae are normal.  Neck: Neck supple.  Cardiovascular: Normal rate and regular rhythm.  No murmur heard. Pulmonary/Chest: Effort normal and breath sounds normal. No stridor. No respiratory distress. She has no decreased breath  sounds. She has no wheezes. She has no rhonchi. She has no rales.  Abdominal: Soft. There is no tenderness.  No abdominal or CVA tenderness  Musculoskeletal: She exhibits no edema.  Neurological: She is alert.  Skin: Skin is warm and dry.  Psychiatric: She has a normal mood and affect.  Nursing note and vitals reviewed.    UC Treatments / Results  Labs (all labs ordered are listed, but only abnormal results are displayed) Labs Reviewed  GLUCOSE, CAPILLARY  POCT URINALYSIS DIP (DEVICE)    EKG  EKG Interpretation None       Radiology No results found.  Procedures Procedures (including critical care time)  Medications Ordered in UC Medications - No data to display   Initial Impression / Assessment and Plan / UC Course  I have reviewed the triage vital signs and the nursing notes.  Pertinent labs & imaging results that were available during my care of the patient were reviewed by me and considered in my medical decision making (see chart for details).     Patient has a normal urinalysis here today without evidence of infection.  Patient also has a normal glucose.  Possible interstitial cystitis? -Will refer to urology 6 months of intermittent left upper quadrant abdominal pain after eating.  Possible gastritis -will refer to GI.  Will start on 40 mg of Prilosec daily  Final Clinical Impressions(s) / UC Diagnoses   Final diagnoses:  Urinary frequency  Gastritis and gastroduodenitis    ED Discharge Orders        Ordered    phenazopyridine (PYRIDIUM) 200 MG tablet  3 times daily     12/21/17 1719    omeprazole (PRILOSEC) 20 MG capsule  Daily     12/21/17 1719       Controlled Substance Prescriptions [place] Controlled Substance Registry consulted? Not Applicable   Alecia LemmingBlue, Olivia C, [place]PA-C 12/21/17 1735

## 2018-01-14 DIAGNOSIS — R300 Dysuria: Secondary | ICD-10-CM | POA: Diagnosis not present

## 2018-02-06 DIAGNOSIS — J101 Influenza due to other identified influenza virus with other respiratory manifestations: Secondary | ICD-10-CM | POA: Diagnosis not present

## 2018-03-12 DIAGNOSIS — N871 Moderate cervical dysplasia: Secondary | ICD-10-CM | POA: Diagnosis not present

## 2018-03-12 DIAGNOSIS — R87613 High grade squamous intraepithelial lesion on cytologic smear of cervix (HGSIL): Secondary | ICD-10-CM | POA: Diagnosis not present

## 2018-03-28 ENCOUNTER — Encounter: Payer: Self-pay | Admitting: Physician Assistant

## 2018-06-11 DIAGNOSIS — R0602 Shortness of breath: Secondary | ICD-10-CM | POA: Diagnosis not present

## 2018-06-11 DIAGNOSIS — I10 Essential (primary) hypertension: Secondary | ICD-10-CM | POA: Diagnosis not present

## 2018-08-19 DIAGNOSIS — I10 Essential (primary) hypertension: Secondary | ICD-10-CM | POA: Diagnosis not present

## 2018-08-19 DIAGNOSIS — Z0000 Encounter for general adult medical examination without abnormal findings: Secondary | ICD-10-CM | POA: Diagnosis not present

## 2018-08-19 DIAGNOSIS — Z23 Encounter for immunization: Secondary | ICD-10-CM | POA: Diagnosis not present

## 2018-08-22 DIAGNOSIS — R109 Unspecified abdominal pain: Secondary | ICD-10-CM | POA: Diagnosis not present

## 2018-08-22 DIAGNOSIS — Z0000 Encounter for general adult medical examination without abnormal findings: Secondary | ICD-10-CM | POA: Diagnosis not present

## 2019-01-14 DIAGNOSIS — J343 Hypertrophy of nasal turbinates: Secondary | ICD-10-CM | POA: Diagnosis not present

## 2019-01-14 DIAGNOSIS — R196 Halitosis: Secondary | ICD-10-CM | POA: Insufficient documentation

## 2019-01-14 DIAGNOSIS — R0982 Postnasal drip: Secondary | ICD-10-CM | POA: Diagnosis not present

## 2019-01-22 DIAGNOSIS — Z01419 Encounter for gynecological examination (general) (routine) without abnormal findings: Secondary | ICD-10-CM | POA: Diagnosis not present

## 2019-01-22 DIAGNOSIS — Z8041 Family history of malignant neoplasm of ovary: Secondary | ICD-10-CM | POA: Diagnosis not present

## 2019-01-22 DIAGNOSIS — Z6830 Body mass index (BMI) 30.0-30.9, adult: Secondary | ICD-10-CM | POA: Diagnosis not present

## 2019-03-05 DIAGNOSIS — Z8041 Family history of malignant neoplasm of ovary: Secondary | ICD-10-CM | POA: Diagnosis not present

## 2019-03-05 DIAGNOSIS — N941 Unspecified dyspareunia: Secondary | ICD-10-CM | POA: Diagnosis not present

## 2019-04-14 DIAGNOSIS — J0110 Acute frontal sinusitis, unspecified: Secondary | ICD-10-CM | POA: Diagnosis not present

## 2019-07-04 ENCOUNTER — Other Ambulatory Visit: Payer: Self-pay | Admitting: Gastroenterology

## 2019-07-04 DIAGNOSIS — R109 Unspecified abdominal pain: Secondary | ICD-10-CM

## 2019-07-15 ENCOUNTER — Ambulatory Visit
Admission: RE | Admit: 2019-07-15 | Discharge: 2019-07-15 | Disposition: A | Discharge status: Home / Self Care | Payer: 59 | Source: Ambulatory Visit | Attending: Gastroenterology | Admitting: Gastroenterology

## 2019-07-15 DIAGNOSIS — R109 Unspecified abdominal pain: Secondary | ICD-10-CM

## 2019-07-15 MED ORDER — IOPAMIDOL (ISOVUE-300) INJECTION 61%
100.0000 mL | Freq: Once | INTRAVENOUS | Status: AC | PRN
  Administered 2019-07-15: 100 mL via INTRAVENOUS

## 2019-12-29 ENCOUNTER — Ambulatory Visit: Payer: 59 | Admitting: Family Medicine

## 2020-01-07 ENCOUNTER — Ambulatory Visit: Payer: Self-pay

## 2020-01-07 ENCOUNTER — Other Ambulatory Visit: Payer: Self-pay

## 2020-01-07 ENCOUNTER — Ambulatory Visit: Payer: 59 | Admitting: Family Medicine

## 2020-01-07 ENCOUNTER — Encounter: Payer: Self-pay | Admitting: Family Medicine

## 2020-01-07 DIAGNOSIS — R0781 Pleurodynia: Secondary | ICD-10-CM | POA: Diagnosis not present

## 2020-01-07 NOTE — Progress Notes (Signed)
Office Visit Note   Patient: [name]           Date of Birth: [birth year]           MRN: [id number] Visit Date: 01/07/2020 Requested by: Farris Has, MD [address] [place],  [place] [address] PCP: Farris Has, MD  Subjective: Chief Complaint  Patient presents with  . Pain left ribs/flank area x 5 yrs, worse lately    HPI: She is here with left rib cage pain.  About 5 years ago she recalls bending over something and feeling some pain in the anterior lateral left lower rib cage.  It bothered her but was not severely painful, so she did not think much of it.  Over the years she continued to have pain and she was evaluated with CT scan last July which was unrevealing.  She was treated for acid reflux which did not make any difference.  The pain is worse when doing yoga stretches and other twisting activities, gets better with rest.  It is not severe enough to warrant medication.  No bowel or bladder dysfunction, no significant thoracic pain.  She never had a rash.              ROS: No fever or chills.  All other systems were reviewed and are negative.  Objective: Vital Signs: There were no vitals taken for this visit.  Physical Exam:  General:  Alert and oriented, in no acute distress. Pulm:  Breathing unlabored. Psy:  Normal mood, congruent affect. Skin: No rash. Ribs: She is tender in the lower anterior ribs and near the xiphoid.  There is no crepitation or step-off.  There is no single area of pain, but a region of about 6 to 8 cm that hurts.  No significant tenderness along the thoracic spinous processes.  Imaging: CT images were reviewed on computer from July.  Limited diagnostic ultrasound: The area of pain was viewed with linear probe, I do not see any muscle defects or bony cortex irregularities.  No clear-cut explanation for her pain on ultrasound.   Assessment & Plan: 1.  Chronic left anterolateral rib pain, etiology uncertain.  Could be  subluxation of the ribs, muscular dysfunction, or possibly thoracic disc protrusion causing nerve mediated pain. -Discussed options with her and elected to try both physical therapy at [facility] and chiropractic per Dr. Manson Passey. -If symptoms persist, we will order x-rays and MRI scan of the thoracic spine.     Procedures: No procedures performed  No notes on file     PMFS History: Patient Active Problem List   Diagnosis Date Noted  . Halitosis 01/14/2019  . Anxiety and depression   . Obesity 10/10/2007   Past Medical History:  Diagnosis Date  . Abnormal Pap smear of cervix 10/2007   colposcopy 12/2007  . Anxiety and depression    self-harm 07/2012  . HTN complicating peripregnancy, antepartum   . Post partum depression     Family History  Problem Relation Age of Onset  . Mental illness Mother        depression  . Hypertension Maternal Grandmother     Past Surgical History:  Procedure Laterality Date  . CESAREAN SECTION     Social History   Occupational History  . Occupation: homemaker  . 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 Smoking status: Former Games developer  . Smokeless tobacco: Never Used  Substance and  Sexual Activity  . Alcohol use: Yes    Comment: occ.   . Drug use: No    Comment: previous marijuana to self-treat anxiety  . Sexual activity: Yes    Partners: Male    Birth control/protection: Surgical

## 2021-05-04 ENCOUNTER — Other Ambulatory Visit: Payer: Self-pay | Admitting: Obstetrics and Gynecology

## 2021-05-04 DIAGNOSIS — N6320 Unspecified lump in the left breast, unspecified quadrant: Secondary | ICD-10-CM

## 2021-06-01 ENCOUNTER — Other Ambulatory Visit: Payer: Self-pay

## 2021-06-01 ENCOUNTER — Ambulatory Visit
Admission: RE | Admit: 2021-06-01 | Discharge: 2021-06-01 | Disposition: A | Discharge status: Home / Self Care | Payer: 59 | Source: Ambulatory Visit | Attending: Obstetrics and Gynecology | Admitting: Obstetrics and Gynecology

## 2021-06-01 ENCOUNTER — Other Ambulatory Visit: Payer: Self-pay | Admitting: Obstetrics and Gynecology

## 2021-06-01 DIAGNOSIS — N6320 Unspecified lump in the left breast, unspecified quadrant: Secondary | ICD-10-CM

## 2021-06-09 ENCOUNTER — Other Ambulatory Visit: Payer: Self-pay | Admitting: Obstetrics and Gynecology

## 2021-06-09 ENCOUNTER — Other Ambulatory Visit: Payer: Self-pay

## 2021-06-09 ENCOUNTER — Ambulatory Visit
Admission: RE | Admit: 2021-06-09 | Discharge: 2021-06-09 | Disposition: A | Discharge status: Home / Self Care | Payer: 59 | Source: Ambulatory Visit | Attending: Obstetrics and Gynecology | Admitting: Obstetrics and Gynecology

## 2021-06-09 DIAGNOSIS — N6320 Unspecified lump in the left breast, unspecified quadrant: Secondary | ICD-10-CM

## 2021-08-11 ENCOUNTER — Other Ambulatory Visit: Payer: 59

## 2021-12-05 ENCOUNTER — Ambulatory Visit
Admission: RE | Admit: 2021-12-05 | Discharge: 2021-12-05 | Disposition: A | Discharge status: Home / Self Care | Payer: 59 | Source: Ambulatory Visit | Attending: Obstetrics and Gynecology | Admitting: Obstetrics and Gynecology

## 2021-12-05 ENCOUNTER — Other Ambulatory Visit: Payer: Self-pay | Admitting: Obstetrics and Gynecology

## 2021-12-05 DIAGNOSIS — N6320 Unspecified lump in the left breast, unspecified quadrant: Secondary | ICD-10-CM

## 2021-12-11 ENCOUNTER — Other Ambulatory Visit: Payer: Self-pay

## 2021-12-11 ENCOUNTER — Ambulatory Visit (HOSPITAL_COMMUNITY): Admission: EM | Admit: 2021-12-11 | Discharge: 2021-12-11 | Discharge status: Against Medical Advice | Payer: 59

## 2022-06-06 ENCOUNTER — Other Ambulatory Visit: Payer: 59

## 2023-01-14 ENCOUNTER — Emergency Department (HOSPITAL_BASED_OUTPATIENT_CLINIC_OR_DEPARTMENT_OTHER)
Admission: EM | Admit: 2023-01-14 | Discharge: 2023-01-14 | Disposition: A | Discharge status: Home / Self Care | Payer: 59 | Attending: Emergency Medicine | Admitting: Emergency Medicine

## 2023-01-14 ENCOUNTER — Emergency Department (HOSPITAL_BASED_OUTPATIENT_CLINIC_OR_DEPARTMENT_OTHER): Payer: 59

## 2023-01-14 ENCOUNTER — Encounter (HOSPITAL_BASED_OUTPATIENT_CLINIC_OR_DEPARTMENT_OTHER): Payer: Self-pay | Admitting: Emergency Medicine

## 2023-01-14 ENCOUNTER — Other Ambulatory Visit: Payer: Self-pay

## 2023-01-14 DIAGNOSIS — M545 Low back pain, unspecified: Secondary | ICD-10-CM | POA: Diagnosis not present

## 2023-01-14 DIAGNOSIS — R110 Nausea: Secondary | ICD-10-CM | POA: Insufficient documentation

## 2023-01-14 DIAGNOSIS — R109 Unspecified abdominal pain: Secondary | ICD-10-CM

## 2023-01-14 DIAGNOSIS — R350 Frequency of micturition: Secondary | ICD-10-CM | POA: Insufficient documentation

## 2023-01-14 LAB — CBC WITH DIFFERENTIAL/PLATELET
Abs Immature Granulocytes: 0.02 10*3/uL (ref 0.00–0.07)
Basophils Absolute: 0.1 10*3/uL (ref 0.0–0.1)
Basophils Relative: 1 %
Eosinophils Absolute: 0.1 10*3/uL (ref 0.0–0.5)
Eosinophils Relative: 2 %
HCT: 40.9 % (ref 36.0–46.0)
Hemoglobin: 14.1 g/dL (ref 12.0–15.0)
Immature Granulocytes: 0 %
Lymphocytes Relative: 29 %
Lymphs Abs: 2.6 10*3/uL (ref 0.7–4.0)
MCH: 31.6 pg (ref 26.0–34.0)
MCHC: 34.5 g/dL (ref 30.0–36.0)
MCV: 91.7 fL (ref 80.0–100.0)
Monocytes Absolute: 0.7 10*3/uL (ref 0.1–1.0)
Monocytes Relative: 8 %
Neutro Abs: 5.4 10*3/uL (ref 1.7–7.7)
Neutrophils Relative %: 60 %
Platelets: 291 10*3/uL (ref 150–400)
RBC: 4.46 MIL/uL (ref 3.87–5.11)
RDW: 11.2 % — ABNORMAL LOW (ref 11.5–15.5)
WBC: 9 10*3/uL (ref 4.0–10.5)
nRBC: 0 % (ref 0.0–0.2)

## 2023-01-14 LAB — URINALYSIS, ROUTINE W REFLEX MICROSCOPIC
Bilirubin Urine: NEGATIVE
Glucose, UA: NEGATIVE mg/dL
Hgb urine dipstick: NEGATIVE
Ketones, ur: 15 mg/dL — AB
Leukocytes,Ua: NEGATIVE
Nitrite: NEGATIVE
Protein, ur: NEGATIVE mg/dL
Specific Gravity, Urine: 1.009 (ref 1.005–1.030)
pH: 5.5 (ref 5.0–8.0)

## 2023-01-14 LAB — COMPREHENSIVE METABOLIC PANEL
ALT: 24 U/L (ref 0–44)
AST: 18 U/L (ref 15–41)
Albumin: 4.9 g/dL (ref 3.5–5.0)
Alkaline Phosphatase: 26 U/L — ABNORMAL LOW (ref 38–126)
Anion gap: 10 (ref 5–15)
BUN: 13 mg/dL (ref 6–20)
CO2: 26 mmol/L (ref 22–32)
Calcium: 9.4 mg/dL (ref 8.9–10.3)
Chloride: 101 mmol/L (ref 98–111)
Creatinine, Ser: 0.92 mg/dL (ref 0.44–1.00)
GFR, Estimated: >60 mL/min (ref >60)
Glucose, Bld: 106 mg/dL — ABNORMAL HIGH (ref 70–99)
Potassium: 3.3 mmol/L — ABNORMAL LOW (ref 3.5–5.1)
Sodium: 137 mmol/L (ref 135–145)
Total Bilirubin: 0.6 mg/dL (ref 0.3–1.2)
Total Protein: 7.4 g/dL (ref 6.5–8.1)

## 2023-01-14 LAB — LACTIC ACID, PLASMA: Lactic Acid, Venous: 1 mmol/L (ref 0.5–1.9)

## 2023-01-14 LAB — LIPASE, BLOOD: Lipase: 11 U/L (ref 11–51)

## 2023-01-14 LAB — PREGNANCY, URINE: Preg Test, Ur: NEGATIVE

## 2023-01-14 NOTE — ED Provider Triage Note (Signed)
Emergency Medicine Provider Triage Evaluation Note  [name] , a 47 y.o. female  was evaluated in triage.  Pt complains of left-sided flank pain and urinary frequency with strong odor x 5 days.  She was seen at outside urgent care 3 days ago and started on Cipro with no improvement of symptoms.  She endorses temperatures in the 99's at home has not measured a fever.  She reports symptoms have been constant.  She reports small episodes of nonbloody/nonbilious emesis.  She denies any additional concerns.  Review of Systems  Positive: Dysuria, urinary frequency, left-sided flank pain Negative: Chest pain or any additional concerns  Physical Exam  BP (!) 151/118 (BP Location: Right Arm)   Pulse (!) 104   Temp 98.3 F (36.8 C) (Oral)   Resp 18   LMP 01/11/2023 (Approximate)   SpO2 100%  Gen:   Awake, no distress   Resp:  Normal effort  MSK:   Moves extremities without difficulty    Medical Decision Making  Medically screening exam initiated at 6:23 PM.  Appropriate orders placed.  NADIYA PIERATT was informed that the remainder of the evaluation will be completed by another provider, this initial triage assessment does not replace that evaluation, and the importance of remaining in the ED until their evaluation is complete.   Note: Portions of this report may have been transcribed using voice recognition software. Every effort was made to ensure accuracy; however, inadvertent computerized transcription errors may still be present.    Deliah Boston, [place] 01/14/23 1824

## 2023-01-14 NOTE — ED Triage Notes (Signed)
Pt arrived POV, caox4, ambulatory. Pt c/o L lower back pain, increased urinary frequency with strong odor, and chills since 1/18. Pt was seen at [facility] Thursday for same and was given antibiotic for possible UTI, she states her urine culture is still pending from UC.

## 2023-01-14 NOTE — Discharge Instructions (Addendum)
You were evaluated today for urinary frequency and left-sided back/flank pain.  Your workup was grossly unremarkable with a reassuring CT scan showing no intra-abdominal cause of your pain.  Your urine shows no signs of infection at this time.  I do recommend that you finish the current antibiotics and follow-up on the previously collected urine culture.  I also recommend you follow-up with your primary care provider to discuss her urinary frequency.  If you develop any life-threatening symptoms such as complete inability to urinate please return to the emergency department

## 2023-01-14 NOTE — ED Provider Notes (Signed)
[facility] Provider Note   CSN: [id number] Arrival date & time: 01/14/23  1733     History  Chief Complaint  Patient presents with   Urinary Frequency    [name] is a 47 y.o. female.  Patient presents the emergency department complaining of left-sided lower back pain, increased urinary frequency, chills which began on January 18.  She was seen at urgent care on Thursday of this week for the same complaint and was diagnosed with a possible UTI, prescribed antibiotics.  The urgent care did send urine for a culture which has not returned as of this time.  Patient today is here because she continues to have symptoms.  She denies abdominal pain, vomiting, chest pain, shortness of breath, vaginal discharge, vaginal bleeding.  She does complain of some mild nausea, left-sided low back pain, increased urinary frequency, "foul odor" to urine.  Past medical history significant for anxiety and depression  HPI     Home Medications Prior to Admission medications   Medication Sig Start Date End Date Taking? Authorizing Provider  acetaminophen (TYLENOL) 500 MG tablet Take 1,000 mg by mouth every 6 (six) hours as needed for moderate pain.    [provider]  amLODipine (NORVASC) 10 MG tablet Take 10 mg by mouth daily. 01/01/20   [provider]  buPROPion (WELLBUTRIN XL) 150 MG 24 hr tablet TAKE 1 TABLET BY MOUTH EVERY MORNING 06/20/16   Wardell Honour, MD  losartan (COZAAR) 50 MG tablet Take 50 mg by mouth daily. 12/17/19   [provider]      Allergies    Amoxicillin    Review of Systems   Review of Systems  Constitutional:  Negative for fever.  Respiratory:  Negative for shortness of breath.   Cardiovascular:  Negative for chest pain.  Gastrointestinal:  Positive for constipation and nausea. Negative for abdominal pain, diarrhea and vomiting.  Genitourinary:  Positive for flank pain and frequency.     Physical Exam Updated Vital Signs BP (!) 145/96   Pulse 78   Temp 98.3 F (36.8 C) (Oral)   Resp 18   LMP 01/11/2023 (Approximate)   SpO2 98%  Physical Exam Vitals and nursing note reviewed.  Constitutional:      General: She is not in acute distress.    Appearance: She is well-developed.  HENT:     Head: Normocephalic and atraumatic.  Eyes:     Conjunctiva/sclera: Conjunctivae normal.  Cardiovascular:     Rate and Rhythm: Normal rate and regular rhythm.     Heart sounds: No murmur heard. Pulmonary:     Effort: Pulmonary effort is normal. No respiratory distress.     Breath sounds: Normal breath sounds.  Abdominal:     Palpations: Abdomen is soft.     Tenderness: There is no abdominal tenderness. There is left CVA tenderness (Mild).  Musculoskeletal:        General: No swelling.     Cervical back: Neck supple.  Skin:    General: Skin is warm and dry.     Capillary Refill: Capillary refill takes less than 2 seconds.  Neurological:     Mental Status: She is alert.  Psychiatric:        Mood and Affect: Mood normal.     ED Results / Procedures / Treatments   Labs (all labs ordered are listed, but only abnormal results are displayed) Labs Reviewed  CBC WITH DIFFERENTIAL/PLATELET - Abnormal; Notable for the  following components:      Result Value   RDW 11.2 (*)    All other components within normal limits  COMPREHENSIVE METABOLIC PANEL - Abnormal; Notable for the following components:   Potassium 3.3 (*)    Glucose, Bld 106 (*)    Alkaline Phosphatase 26 (*)    All other components within normal limits  URINALYSIS, ROUTINE W REFLEX MICROSCOPIC - Abnormal; Notable for the following components:   Color, Urine COLORLESS (*)    Ketones, ur 15 (*)    All other components within normal limits  LIPASE, BLOOD  LACTIC ACID, PLASMA  PREGNANCY, URINE    EKG None  Radiology CT Renal Stone Study  Result Date: 01/14/2023 CLINICAL DATA:  Abdominal pain.  Concern  for kidney stone. EXAM: CT ABDOMEN AND PELVIS WITHOUT CONTRAST TECHNIQUE: Multidetector CT imaging of the abdomen and pelvis was performed following the standard protocol without IV contrast. RADIATION DOSE REDUCTION: This exam was performed according to the departmental dose-optimization program which includes automated exposure control, adjustment of the mA and/or kV according to patient size and/or use of iterative reconstruction technique. COMPARISON:  CT abdomen pelvis dated 07/15/2019. FINDINGS: Evaluation of this exam is limited in the absence of intravenous contrast. Lower chest: The visualized lung bases are clear. No intra-abdominal free air or free fluid. Hepatobiliary: No focal liver abnormality is seen. No gallstones, gallbladder wall thickening, or biliary dilatation. Pancreas: Unremarkable. No pancreatic ductal dilatation or surrounding inflammatory changes. Spleen: Normal in size without focal abnormality. Adrenals/Urinary Tract: The adrenal glands, kidneys, visualized ureters, and urinary bladder appear unremarkable. Stomach/Bowel: There is no bowel obstruction or active inflammation. The appendix is normal. Vascular/Lymphatic: Mild atherosclerotic calcification of the iliac arteries. The aorta and IVC are otherwise grossly unremarkable on this noncontrast CT. No portal venous gas. There is no adenopathy. Reproductive: The uterus is anteverted and grossly unremarkable. No adnexal masses. Other: None Musculoskeletal: Bilateral L5 pars defects. No listhesis. No acute osseous pathology. IMPRESSION: 1. No acute intra-abdominal or pelvic pathology. No hydronephrosis or nephrolithiasis. 2. Bilateral L5 pars defects. No listhesis. Electronically Signed   By: Elgie Collard M.D.   On: 01/14/2023 21:01    Procedures Procedures    Medications Ordered in ED Medications - No data to display  ED Course/ Medical Decision Making/ A&P                             Medical Decision Making Amount and/or  Complexity of Data Reviewed Labs: ordered. Radiology: ordered.   This patient presents to the ED for concern of urinary frequency and flank pain, this involves an extensive number of treatment options, and is a complaint that carries with it a high risk of complications and morbidity.  The differential diagnosis includes nephrolithiasis, pyelonephritis, urinary tract infection, and others   Co morbidities that complicate the patient evaluation  History anxiety    Lab Tests:  I Ordered, and personally interpreted labs.  The pertinent results include: UA showing 15 ketones, potassium 3.3, lipase 11, negative urine pregnancy test, initial lactic acid of 1.0   Imaging Studies ordered:  I ordered imaging studies including CT renal stone study I independently visualized and interpreted imaging which showed  1. No acute intra-abdominal or pelvic pathology. No hydronephrosis  or nephrolithiasis.  2. Bilateral L5 pars defects. No listhesis   I agree with the radiologist interpretation   Test / Admission - Considered:  No acute abnormality was noted on  the patient's workup today.  Her lab work was grossly unremarkable.  CT scan was unremarkable.  This does not appear to be pyelonephritis, nephrolithiasis.  No other intra-abdominal pathology noted.  The patient is currently on antibiotics for possible UTI with culture results pending.  Recommend she follow-up with his culture results but urine today appeared unremarkable.  I have recommended to the patient that she follow-up with her primary care provider for further evaluation and management as needed.  If she continues to have symptoms of frequency she may need to eventually see urology.         Final Clinical Impression(s) / ED Diagnoses Final diagnoses:  Urinary frequency  Left flank pain    Rx / DC Orders ED Discharge Orders     None         Ronny Bacon 01/14/23 2126    Davonna Belling, MD 01/14/23  2322

## 2023-01-14 NOTE — ED Notes (Signed)
Documented time in error. Should read 2008

## 2023-01-25 NOTE — Progress Notes (Deleted)
[place] Phone: [phone] Subjective:    I'm seeing this patient by the request  of:  London Pepper, MD  CC:   [id number]  [name] is a 47 y.o. female coming in with complaint of LBP band hip pain. 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    Drug use: No    Comment: previous marijuana to self-treat anxiety   Sexual activity: Yes    Partners: Male    Birth control/protection: Surgical  Other Topics Concern   Not on file  Social History Narrative   Step-father adopted this patient and raised her.   Lives with her husband and their two daughters.   Social Determinants of Health   Financial Resource Strain: Not on file  Food Insecurity: Not on file  Transportation Needs: Not on file  Physical Activity: Not on file  Stress: Not on file  Social Connections: Not on file   Allergies  Allergen Reactions   Amoxicillin Nausea Only   Family History  Problem Relation Age of Onset   Mental illness Mother        depression   Hypertension Maternal Grandmother      Current Outpatient Medications (Cardiovascular):    amLODipine (NORVASC) 10 MG  tablet, Take 10 mg by mouth daily.   losartan (COZAAR) 50 MG tablet, Take 50 mg by mouth daily.   Current Outpatient Medications (Analgesics):    acetaminophen (TYLENOL) 500 MG tablet, Take 1,000 mg by mouth every 6 (six) hours as needed for moderate pain.   Current Outpatient Medications (Other):    buPROPion (WELLBUTRIN XL) 150 MG 24 hr tablet, TAKE 1 TABLET BY MOUTH EVERY MORNING   Reviewed prior external information including notes and imaging from  primary care provider As well as notes that were available from care everywhere and other healthcare systems.  Past medical history, social, surgical and family history all reviewed in electronic medical record.  No pertanent information unless stated regarding to the chief complaint.   Review of Systems:  No headache, visual changes, nausea, vomiting, diarrhea, constipation, dizziness, abdominal pain, skin rash, fevers, chills, night sweats, weight loss, swollen lymph nodes, body aches, joint swelling, chest pain, shortness of breath, mood changes. POSITIVE muscle aches  Objective  Last menstrual period 01/11/2023.   General: No apparent distress alert and oriented x3 mood and affect normal, dressed appropriately.  HEENT: Pupils equal, extraocular movements intact  Respiratory: Patient's speak in full sentences and does not appear short of breath  Cardiovascular: No lower extremity edema, non tender, no erythema  Impression and Recommendations:

## 2023-01-30 ENCOUNTER — Ambulatory Visit: Payer: 59 | Admitting: Family Medicine

## 2023-02-13 ENCOUNTER — Encounter: Payer: Self-pay | Admitting: Gastroenterology

## 2023-02-16 ENCOUNTER — Encounter: Payer: Self-pay | Admitting: Obstetrics and Gynecology

## 2023-02-16 ENCOUNTER — Ambulatory Visit: Payer: 59 | Admitting: Obstetrics and Gynecology

## 2023-02-16 VITALS — BP 154/115 | HR 94 | Ht 59.84 in | Wt 136.0 lb

## 2023-02-16 DIAGNOSIS — R3915 Urgency of urination: Secondary | ICD-10-CM

## 2023-02-16 DIAGNOSIS — R350 Frequency of micturition: Secondary | ICD-10-CM

## 2023-02-16 DIAGNOSIS — M62838 Other muscle spasm: Secondary | ICD-10-CM | POA: Diagnosis not present

## 2023-02-16 DIAGNOSIS — N393 Stress incontinence (female) (male): Secondary | ICD-10-CM

## 2023-02-16 LAB — POCT URINALYSIS DIPSTICK
Bilirubin, UA: NEGATIVE
Blood, UA: NEGATIVE
Glucose, UA: NEGATIVE
Ketones, UA: NEGATIVE
Leukocytes, UA: NEGATIVE
Nitrite, UA: NEGATIVE
Protein, UA: NEGATIVE
Spec Grav, UA: 1.01 (ref 1.010–1.025)
Urobilinogen, UA: 0.2 E.U./dL
pH, UA: 7 (ref 5.0–8.0)

## 2023-02-16 NOTE — Patient Instructions (Signed)
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
## 2023-02-19 ENCOUNTER — Encounter: Payer: Self-pay | Admitting: Obstetrics and Gynecology

## 2023-03-22 NOTE — Progress Notes (Signed)
[name] D.[place] Phone: [phone]   Assessment and Plan:     1. Chronic bilateral low back pain with left-sided sciatica 2. Chronic left-sided thoracic back pain 3. Pars defect of lumbar spine 4. Rib cage dysfunction 5. Somatic dysfunction of thoracic region 6. Somatic dysfunction of lumbar region 7. Somatic dysfunction of pelvic region  -Chronic with exacerbation, initial sports medicine visit - Multiple musculoskeletal complaints with most prominent being left-sided rib cage pain radiating from costovertebral junction to costosternal junction junction, thoracic paraspinal TTP, lumbar paraspinal TTP, with history of bilateral L5 pars defects - Patient has had lumbar MRIs with [facility].  We will have her sign a release form so that we can get access to these MRIs which have been performed within the past 6 months.  No additional imaging at today's visit - Start HEP and physical therapy for thoracic spine, lumbar spine and low back, core, L5 pars defect - Patient elected for initial OMT today.  Tolerated well per note below. - Decision today to treat with OMT was based on Physical Exam  After verbal consent patient was treated with HVLA (high velocity low amplitude), ME (muscle energy), FPR (flex positional release), ST (soft tissue), PC/PD (Pelvic Compression/ Pelvic Decompression) techniques in cervical, rib, thoracic, lumbar, and pelvic areas. Patient tolerated the procedure well with improvement in symptoms.  Patient educated on potential side effects of soreness and recommended to rest, hydrate, and use Tylenol as needed for pain control.    Pertinent previous records reviewed include CT renal stone study 01/14/2023   Follow Up: 4 to 6 weeks for reevaluation.  Could consider repeat OMT if patient found today's treatment beneficial.  Could consider NSAID course.   Subjective:   I, Whitney  Idelia Shepherd, am serving as a Education administrator for Doctor Glennon Mac  Chief Complaint: left sided rib pain   HPI:   03/26/2023 Patient is a 47 year old female complaining of rib pain. Patient states that 7 years ago she was in bed twisted and leaned down states she heard a popping noise, she had pain in the intercostal muscles , just wants to make sure there is no tear, is able to take a deep breath with no pain , can feel two spots that are very tense like a tightness, tylenol for the pain along with ice and those both seem to help, pain when wearing a bra due to the tightness, she states she has sciatic issue thinks that can be radiating from her thoracic area, has tried some PT and that helped, had a CT done  Relevant Historical Information: None pertinent  Additional pertinent review of systems negative.   Current Outpatient Medications:    buPROPion (WELLBUTRIN XL) 150 MG 24 hr tablet, TAKE 1 TABLET BY MOUTH EVERY MORNING, Disp: 30 tablet, Rfl: 0   losartan (COZAAR) 50 MG tablet, Take 50 mg by mouth daily., Disp: , Rfl:    Objective:     Vitals:   03/26/23 0819  BP: 132/82  Pulse: 78  SpO2: 100%  Weight: 143 lb (64.9 kg)  Height: 4\' 11"  (1.499 m)      Body mass index is 28.88 kg/m.    Physical Exam:    Gen: Appears well, nad, nontoxic and pleasant Psych: Alert and oriented, appropriate mood and affect Neuro: sensation intact, strength is 5/5 in upper and lower extremities, muscle tone wnl Skin: no susupicious lesions or rashes  Back -  Normal skin, Spine with normal alignment and no deformity.   No tenderness to vertebral process palpation.   Bilateral thoracic and lumbar paraspinous muscles are mildly tender and without spasm  General: Well-appearing, cooperative, sitting comfortably in no acute distress.   OMT Physical Exam:  Rib: Delayed exhalation of left ribs 6-8 compared to right Thoracic: TTP paraspinal, T7 RRSR Lumbar: TTP paraspinal, L1-3 RRSL Pelvis: Right anterior  innominate    Electronically signed by:  [name] D.[facility] 8:55 AM 03/26/23

## 2023-03-26 ENCOUNTER — Ambulatory Visit: Payer: 59 | Admitting: Sports Medicine

## 2023-03-26 VITALS — BP 132/82 | HR 78 | Ht 59.0 in | Wt 143.0 lb

## 2023-03-26 DIAGNOSIS — M4306 Spondylolysis, lumbar region: Secondary | ICD-10-CM | POA: Diagnosis not present

## 2023-03-26 DIAGNOSIS — M545 Low back pain, unspecified: Secondary | ICD-10-CM

## 2023-03-26 DIAGNOSIS — M9902 Segmental and somatic dysfunction of thoracic region: Secondary | ICD-10-CM

## 2023-03-26 DIAGNOSIS — M546 Pain in thoracic spine: Secondary | ICD-10-CM | POA: Diagnosis not present

## 2023-03-26 DIAGNOSIS — G8929 Other chronic pain: Secondary | ICD-10-CM

## 2023-03-26 DIAGNOSIS — M9908 Segmental and somatic dysfunction of rib cage: Secondary | ICD-10-CM

## 2023-03-26 DIAGNOSIS — M9903 Segmental and somatic dysfunction of lumbar region: Secondary | ICD-10-CM

## 2023-03-26 DIAGNOSIS — M9905 Segmental and somatic dysfunction of pelvic region: Secondary | ICD-10-CM

## 2023-03-26 DIAGNOSIS — M5442 Lumbago with sciatica, left side: Secondary | ICD-10-CM | POA: Diagnosis not present

## 2023-03-26 NOTE — Patient Instructions (Addendum)
Good to see you Thoracic and low back HEP  Medical release form  PT referral  4-6  week follow up

## 2023-03-30 ENCOUNTER — Ambulatory Visit: Payer: 59 | Admitting: Gastroenterology

## 2023-04-20 NOTE — Progress Notes (Deleted)
    Aleen Sells D.[facility] [address] [place] [address] Phone: [phone]   Assessment and Plan:     There are no diagnoses linked to this encounter.  ***   Pertinent previous records reviewed include ***   Follow Up: ***     Subjective:   I, Ashanti Ratti, am serving as a Neurosurgeon for Doctor Richardean Sale   Chief Complaint: left sided rib pain    HPI:    03/26/2023 Patient is a 47 year old female complaining of rib pain. Patient states that 7 years ago she was in bed twisted and leaned down states she heard a popping noise, she had pain in the intercostal muscles , just wants to make sure there is no tear, is able to take a deep breath with no pain , can feel two spots that are very tense like a tightness, tylenol for the pain along with ice and those both seem to help, pain when wearing a bra due to the tightness, she states she has sciatic issue thinks that can be radiating from her thoracic area, has tried some PT and that helped, had a CT done  04/23/2023 Patient states    Relevant Historical Information: None pertinent  Additional pertinent review of systems negative.   Current Outpatient Medications:    buPROPion (WELLBUTRIN XL) 150 MG 24 hr tablet, TAKE 1 TABLET BY MOUTH EVERY MORNING, Disp: 30 tablet, Rfl: 0   losartan (COZAAR) 50 MG tablet, Take 50 mg by mouth daily., Disp: , Rfl:    Objective:     There were no vitals filed for this visit.    There is no height or weight on file to calculate BMI.    Physical Exam:    ***   Electronically signed by:  Aleen Sells D.[facility] 7:20 AM 04/20/23

## 2023-04-23 ENCOUNTER — Ambulatory Visit: Payer: 59 | Admitting: Sports Medicine

## 2023-05-10 ENCOUNTER — Encounter: Payer: 59 | Admitting: Physical Therapy

## 2023-05-10 IMAGING — MG DIGITAL DIAGNOSTIC BILAT W/ TOMO W/ CAD
6 of 12 series · 6 of 36 positions shown · non-contrast
Comparison: Previous exam(s).

CLINICAL DATA: 45-year-old female presenting for delayed follow-up
of a probably benign left axillary lymph node. The patient states
she has diffuse intermittent bilateral breast pain with a palpable
lump in the subareolar left breast.

EXAM:
DIGITAL DIAGNOSTIC BILATERAL MAMMOGRAM WITH TOMOSYNTHESIS AND CAD;
ULTRASOUND LEFT BREAST LIMITED
TECHNIQUE: Bilateral digital diagnostic mammography and breast tomosynthesis
was performed. The images were evaluated with computer-aided
detection.; Targeted ultrasound examination of the left breast was
performed.

[L CC synth-2D (1 of 2)]
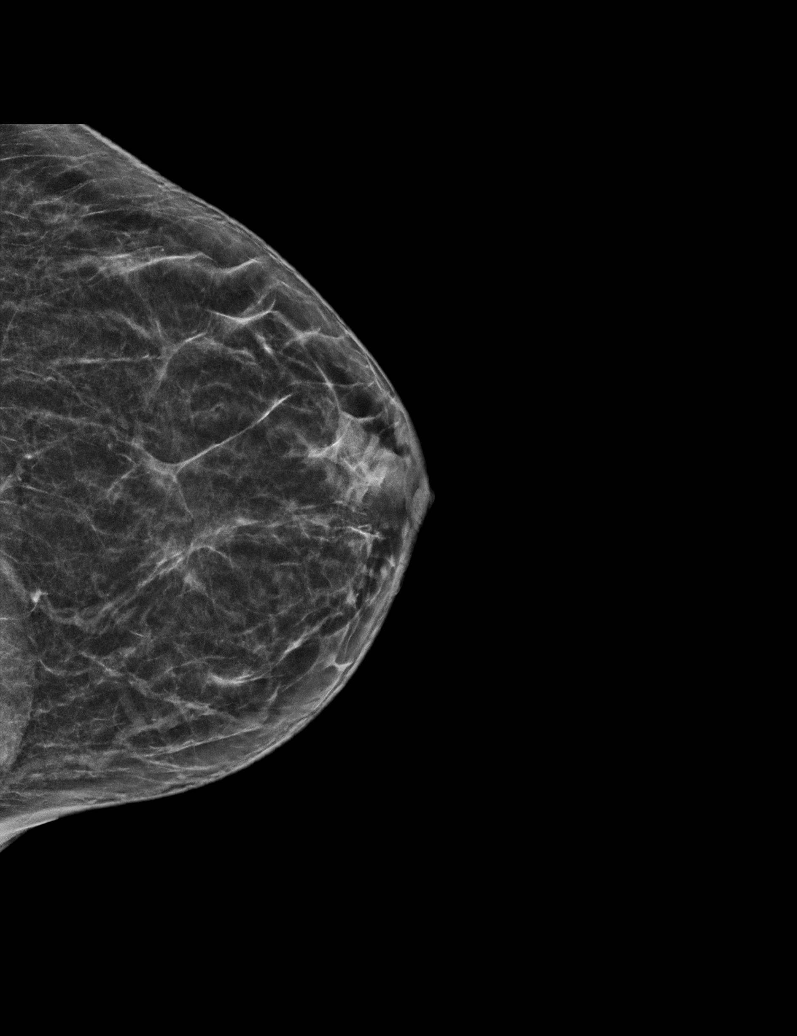

[R CC synth-2D]
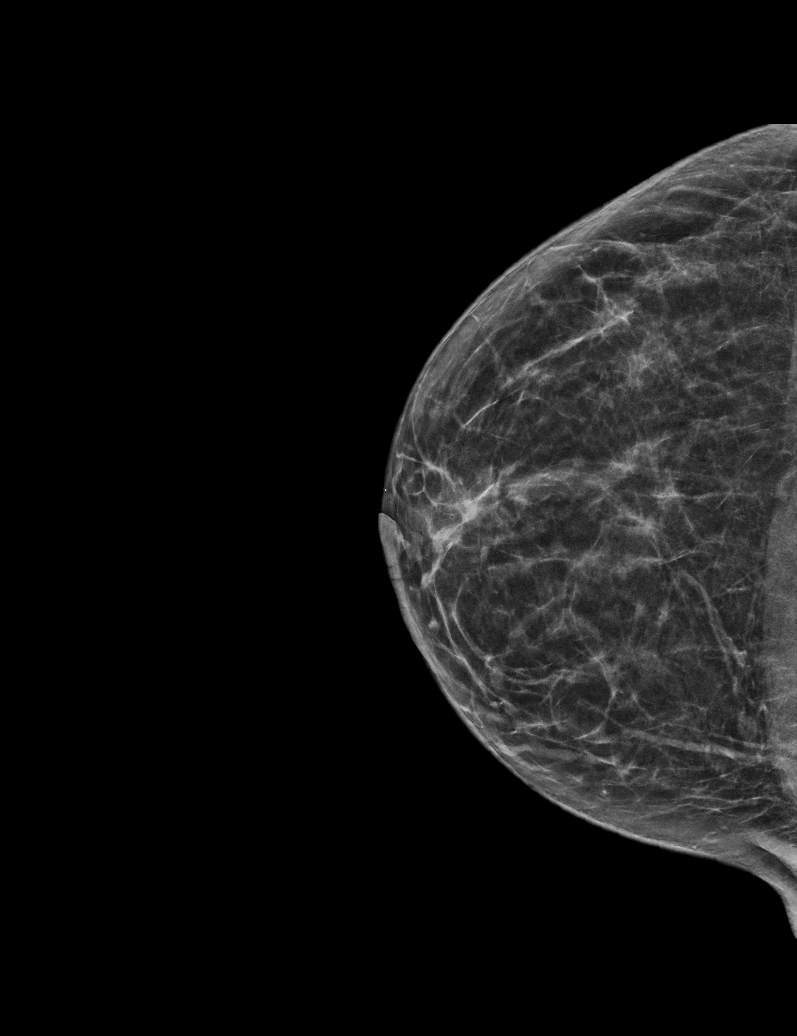

[R MLO synth-2D (1 of 2)]
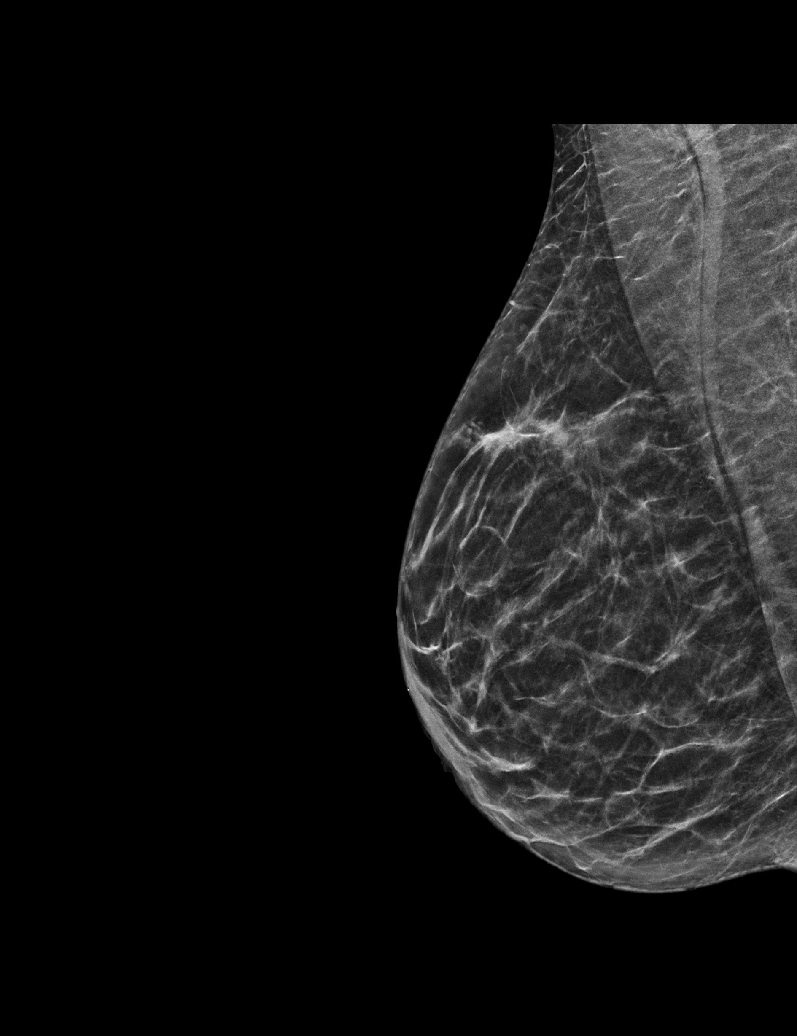

[L CC synth-2D (2 of 2)]
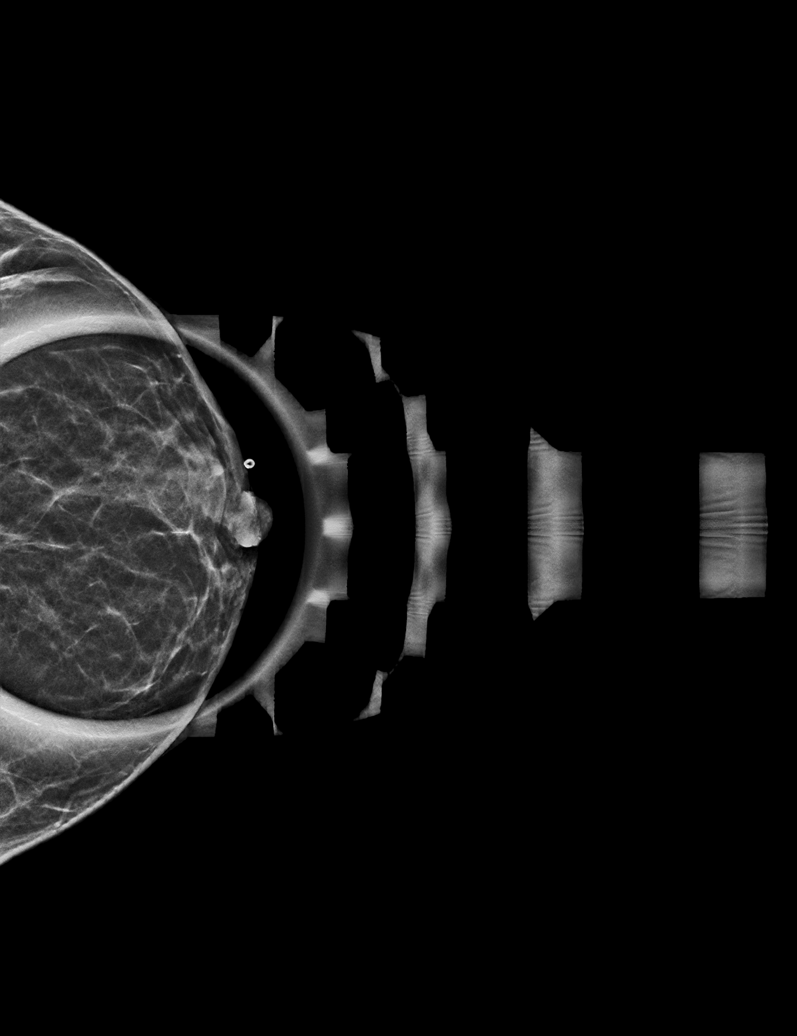

[L MLO synth-2D]
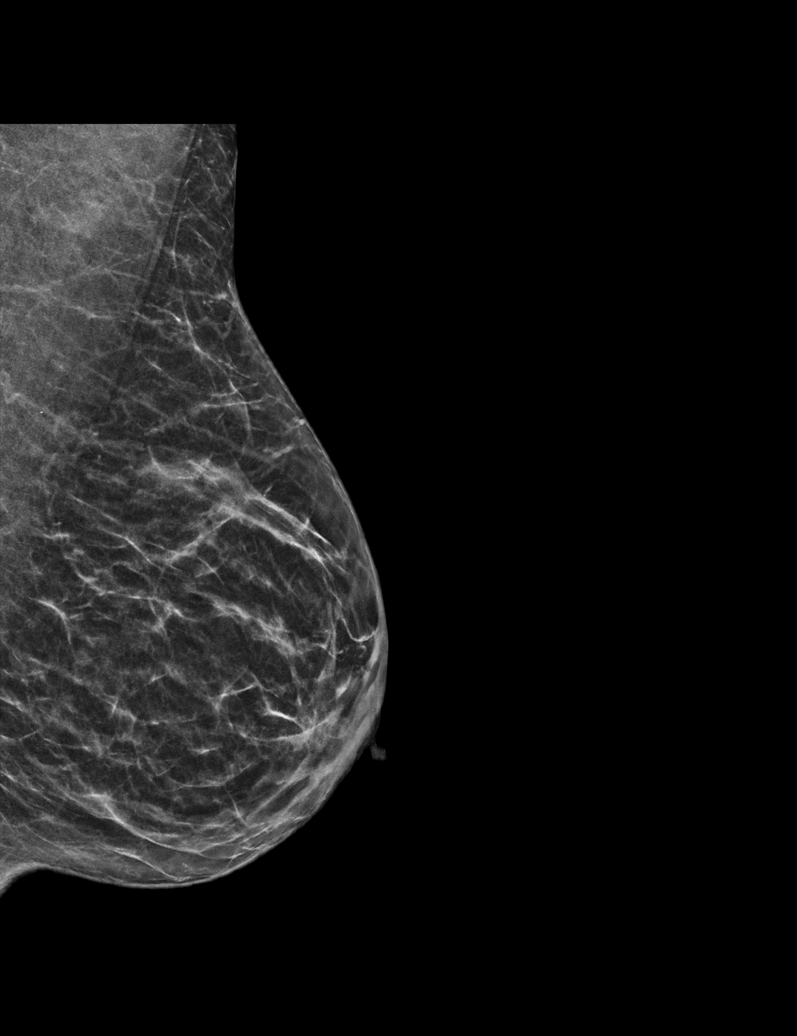

[R MLO synth-2D (2 of 2)]
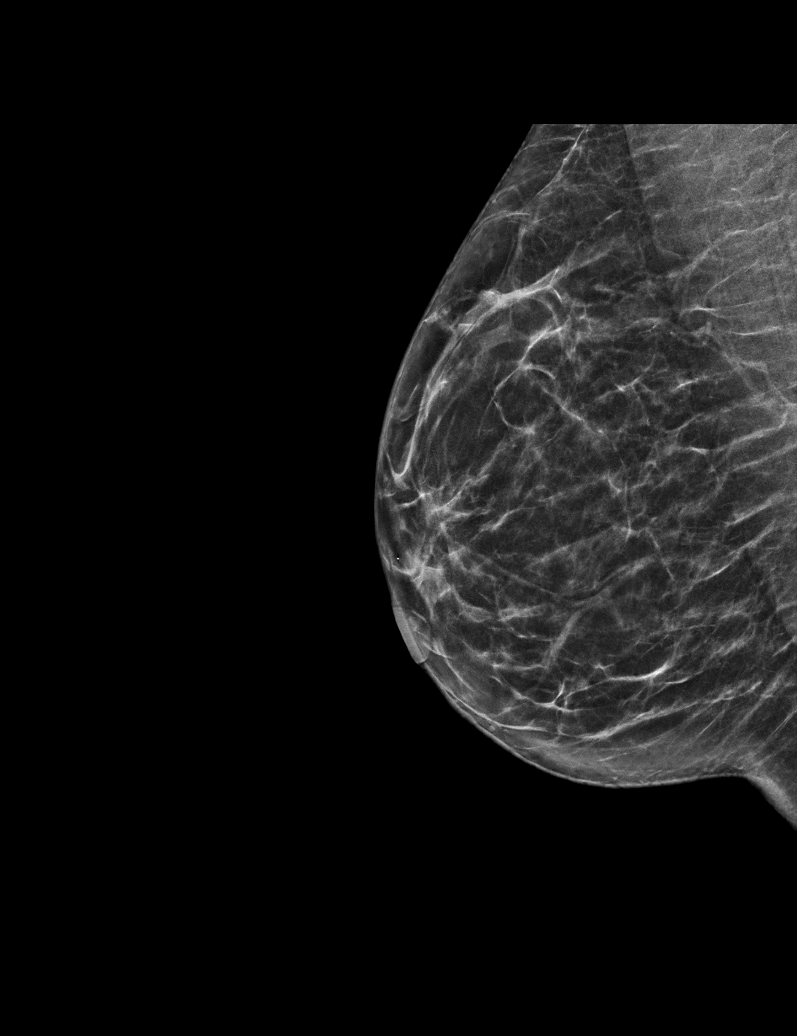

[6 of 36 positions shown; findings below may reference images not displayed]

ACR Breast Density Category b: There are scattered areas of
fibroglandular density.
FINDINGS: No focal or suspicious mammographic findings are identified in
either breast. The parenchymal pattern is stable. No focal or
suspicious sonographic findings are identified in the subareolar
left breast at the site of the patient's palpable lump.

Targeted ultrasound is performed, showing no focal or suspicious
mammographic findings in the subareolar left breast. Evaluation of
the left axilla demonstrates continued interval decrease in size of
focal cortical thickening within a left axillary lymph node. Today
it measures borderline at 3.1 mm (previously 4.4 and 5.3 mm).
IMPRESSION: 1. Probably benign left axillary lymph node demonstrating continued
interval decrease in size of focal cortical thickening. Recommend
continued short-term follow-up.
2. No mammographic evidence of malignancy in either breast.

RECOMMENDATION:
1. Left axillary ultrasound in 6 months.
2. Clinical follow-up recommended for the palpable area of concern
in the left breast. Any further workup should be based on clinical
grounds.

I have discussed the findings and recommendations with the patient.
If applicable, a reminder letter will be sent to the patient
regarding the next appointment.

BI-RADS CATEGORY  3: Probably benign.

## 2023-05-11 ENCOUNTER — Ambulatory Visit: Payer: 59 | Admitting: Sports Medicine

## 2023-05-16 NOTE — Therapy (Unsigned)
OUTPATIENT PHYSICAL THERAPY FEMALE PELVIC EVALUATION   Patient Name: [name] MRN: [id number] DOB:05-04-1976, 47 y.o., female Today's Date: 05/17/2023  END OF SESSION:  PT End of Session - 05/17/23 0859     Visit Number 1    Date for PT Re-Evaluation 08/09/23    Authorization Type [employer]    Authorization - Visit Number 1    Authorization - Number of Visits 57    PT Start Time 0850    PT Stop Time 0930    PT Time Calculation (min) 40 min    Activity Tolerance Patient tolerated treatment well    Behavior During Therapy [facility] for tasks assessed/performed             Past Medical History:  Diagnosis Date   Abnormal Pap smear of cervix 10/2007   colposcopy 12/2007   Anxiety and depression    self-harm 07/2012   HTN complicating peripregnancy, antepartum    Post partum depression    Past Surgical History:  Procedure Laterality Date   CESAREAN SECTION     Patient Active Problem List   Diagnosis Date Noted   Halitosis 01/14/2019   Anxiety and depression    Obesity 10/10/2007    PCP: Farris Has, MD  REFERRING PROVIDER: Marguerita Beards, MD   REFERRING DIAG:  N39.3 (ICD-10-CM) - SUI (stress urinary incontinence, female)  R39.15 (ICD-10-CM) - Urinary urgency  [phone] (ICD-10-CM) - Levator spasm    THERAPY DIAG:  Muscle weakness (generalized)  Coccyx pain  Lack of coordination  Rationale for Evaluation and Treatment: Rehabilitation  ONSET DATE: 2/24  SUBJECTIVE:                                                                                                                                                                                           SUBJECTIVE STATEMENT: Abdominal pain at night, tailbone pain on left side. Patient was seeing a PT for her back. 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 Patient has been doing pilates and yoga for her pelvic floor. She is not having issues with urinary urgency. She reports no urinary leakage. She may notice dampness in her  underwear.  Fluid intake: Yes:  Drinks: 4 cups coffee, about 64oz water per day. Has stopped drinking tea but still has the urgency.   PAIN:  Are you having pain? Yes NPRS scale: 10/10 Pain location:  coccyx  Pain type: burning and sharp, achy Pain description: constant   Aggravating factors: sitting for 1 hour, sitting on harder surface, twisting, over do it with exercise Relieving factors: standing, ice, lay on back with knees up and do deep breathing  PAIN:  Are you having pain? Yes: NPRS scale: 3-7/10 Pain location: left lateral trunk Pain description: sharp achy Aggravating factors: twisting, lift something heavy Relieving factors: muscle relaxer's, shower, bath    PRECAUTIONS: None  WEIGHT BEARING RESTRICTIONS: No  FALLS:  Has patient fallen in last 6 months? 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  PLOF: Independent  PATIENT GOALS: find relief for the tailbone  PERTINENT HISTORY:  Cesarean section Sexual abuse: No  BOWEL MOVEMENT: no issues  URINATION: Pain with urination: No Fully empty bladder: Yes:   Stream: Strong Urgency: No Frequency: 4-6 times during the day, 0 at night Leakage: Sneezing, Laughing, and Exercise; small leakage Pads: No  INTERCOURSE: deep in pelvis especially if not stretched Pain with intercourse: During Penetration, feels better if she is on top Ability to have vaginal penetration:  Yes:   Climax: Yes Marinoff Scale: 1/3  PREGNANCY: C-section deliveries 2  PROLAPSE: None   OBJECTIVE:   DIAGNOSTIC FINDINGS:  Pelvic floor strength I/V   Pelvic floor musculature: Right levator non-tender, Right obturator non-tender, Left levator tender, Left obturator tender- increased tension present PVR of 68 ml was obtained by bladder scan.    COGNITION: Overall cognitive status: Within functional limits for tasks assessed     SENSATION: Light touch: Appears  intact Proprioception: Appears intact   POSTURE: No Significant postural limitations  PELVIC ALIGNMENT:  LUMBARAROM/PROM:  A/PROM A/PROM  eval  Flexion full  Extension Decreased by 25%  Right lateral flexion Decreased by 25% in thoracic  Left lateral flexion full  Right rotation full  Left rotation Decreased by 25% in thoracic   (Blank rows = not tested)  LOWER EXTREMITY ROM:  Passive ROM Right eval Left eval  Hip external rotation 55 60   (Blank rows = not tested)  LOWER EXTREMITY MMT:  MMT Right eval Left eval  Hip abduction 4/5 4/5  Hip adduction 4/5 4/5   PALPATION:   General  decreased movement of left mid to lower rib cage; decreased movement of T5-T10, tenderness located in left diaphragm; restrictions around the c-section scar                External Perineal Exam tightness along the coccygeus and anus                             Internal Pelvic Floor difficulty with relaxing after contraction of the vaginal and anal area, tenderness along the superior bladder and cervix, puborectalis is very tight, not able to bulge the pelvic floor  Patient confirms identification and approves PT to assess internal pelvic floor and treatment Yes  PELVIC MMT:   MMT eval  Vaginal 2/5  Internal Anal Sphincter 2/5  External Anal Sphincter 2/5  Puborectalis 2/5  (Blank rows = not tested)        TONE: increased  PROLAPSE: none  TODAY'S TREATMENT:  DATE: 05/17/23  EVAL Manual: Spinal mobilization:      Mobilization to left posterior and lateral mid to lower rib cage      Mobilization to T5-T10 Exercises: Lateral trunk stretch in sitting and standing      PATIENT EDUCATION:  05/17/23 Education details: lateral trunk stretches Person educated: Patient Education method: Medical illustrator Education comprehension: verbalized  understanding and returned demonstration  HOME EXERCISE PROGRAM: See above  ASSESSMENT:  CLINICAL IMPRESSION: Patient is a 47 y.o. female who was seen today for physical therapy evaluation and treatment for levator spasm and SUI.  She reports she is not having the urgency after having therapy to her back and the urinary leakage has reduced. She reports constant coccyx pain at level 2-10 when she sits or puts pressure on the coccyx. Sometimes she difficulty pushing the stool out. She has left lateral trunk pain at level 3-7/10 with twisting movement. She will leak small amount of urine with sneezing, laughing and with exercise. Pelvic floor strength is 2/5 with difficulty relaxing. Increased tension in the puborectalis. She has tenderness located on the cervix and superior bladder. She reports discomfort with deep penile penetration vaginally. She as restrictions in her c-section scar. Decreased mobility of the left rib cage and T5-T10. Patient will benefit from skilled therapy to improve mobility and reduce pain to improve quality of life.   OBJECTIVE IMPAIRMENTS: decreased activity tolerance, decreased coordination, decreased endurance, decreased mobility, decreased ROM, decreased strength, increased fascial restrictions, and pain.   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   REHAB POTENTIAL: Excellent  CLINICAL DECISION MAKING: Stable/uncomplicated  EVALUATION COMPLEXITY: Low   GOALS: Goals reviewed with patient? Yes  SHORT TERM GOALS: Target date: 06/12/23  Patient independent with stretches to the rib cage and pelvic floor.  Baseline: Goal status: INITIAL  2.  Patient is able to bulge the pelvic floor due to  elongation of the muscles.  Baseline:  Goal status: INITIAL  3.  Patient educated on c-section scar massage to improve tissue mobility.  Baseline:  Goal status: INITIAL   LONG TERM GOALS: Target date: 08/09/23  Patient independent with advanced HEP for hip strength and pelvic floor strength.  Baseline:  Goal status: INITIAL  2.  Patient able to twist her trunk with pain decreased </= 0-1/10 due to improved movement of the thoracic spine and left rib cage.  Baseline:  Goal status: INITIAL  3.  Patient able to sit with coccyx pain decreased </= 0-1/10 due to improve lengthening of the pelvic floor and the ability to relax after a contraction.  Baseline:  Goal status: INITIAL  4.  Patient able to push her stool out when defecating due to improved elongation of the pelvic floor.  Baseline:  Goal status: INITIAL  5.  Patient is able to sneeze, laugh, and exercise without urinary leakage due to the ability to contract and relax the pelvic floor and strength increased >/= 3/5.  Baseline:  Goal status: INITIAL   PLAN:  PT FREQUENCY: 1x/week  PT DURATION: 12 weeks  PLANNED INTERVENTIONS: Therapeutic exercises, Therapeutic activity, Neuromuscular re-education, Patient/Family education, Joint mobilization, Dry Needling, Electrical stimulation, Spinal mobilization, Cryotherapy, Moist heat, scar mobilization, Taping, Ultrasound, Biofeedback, and Manual therapy  PLAN FOR NEXT SESSION: manual therapy to the c-section scar, work on left rib cage  and thoracic mobility, diaphragmatic breathing to bulge the pelvic floor, dry needling to the pelvic floor including puborectalis and coccygeus,    Eulis Foster, PT 05/17/23 10:04 AM

## 2023-05-17 ENCOUNTER — Encounter: Payer: Self-pay | Admitting: Physical Therapy

## 2023-05-17 ENCOUNTER — Encounter: Payer: 59 | Attending: Obstetrics and Gynecology | Admitting: Physical Therapy

## 2023-05-17 ENCOUNTER — Other Ambulatory Visit: Payer: Self-pay

## 2023-05-17 DIAGNOSIS — M533 Sacrococcygeal disorders, not elsewhere classified: Secondary | ICD-10-CM | POA: Insufficient documentation

## 2023-05-17 DIAGNOSIS — M6281 Muscle weakness (generalized): Secondary | ICD-10-CM | POA: Insufficient documentation

## 2023-05-17 DIAGNOSIS — R279 Unspecified lack of coordination: Secondary | ICD-10-CM | POA: Insufficient documentation

## 2023-05-17 NOTE — Progress Notes (Signed)
[name] D.[facility] [address] [place] [address] Phone: [phone]   Assessment and Plan:     1. Chronic bilateral low back pain with left-sided sciatica 2. Pars defect of lumbar spine 3. Somatic dysfunction of thoracic region 4. Somatic dysfunction of lumbar region 5. Somatic dysfunction of pelvic region -Chronic with exacerbation, subsequent visit - Overall moderate to significant improvement in multiple musculoskeletal complaints after starting HEP and OMT at previous office visit.  Patient's most prominent remaining symptoms are related to pelvis and tailbone that worsen with sitting - Recommend continuing pelvic floor physical therapy - Start meloxicam 15 mg daily x2 weeks.  If still having pain after 2 weeks, complete 3rd-week of meloxicam. May use remaining meloxicam as needed once daily for pain control.  Do not to use additional NSAIDs while taking meloxicam.  May use Tylenol [phone] mg 2 to 3 times a day for breakthrough pain. - Recommend using wedge pillow to decrease sacral pain when sitting - Patient has received significant relief with OMT in the past.  Elects for repeat OMT today.  Tolerated well per note below. - Decision today to treat with OMT was based on Physical Exam  After verbal consent patient was treated with HVLA (high velocity low amplitude), ME (muscle energy), FPR (flex positional release), ST (soft tissue), PC/PD (Pelvic Compression/ Pelvic Decompression) techniques in thoracic, lumbar, and pelvic areas. Patient tolerated the procedure well with improvement in symptoms.  Patient educated on potential side effects of soreness and recommended to rest, hydrate, and use Tylenol as needed for pain control.     Pertinent previous records reviewed include none   Follow Up: 4 weeks for reevaluation.  Could consider repeat OMT the patient found today's treatment beneficial   Subjective:   I, [name], am  serving as a Neurosurgeon for Doctor Richardean Sale   Chief Complaint: left sided rib pain    HPI:    03/26/2023 Patient is a 47 year old female complaining of rib pain. Patient states that 7 years ago she was in bed twisted and leaned down states she heard a popping noise, she had pain in the intercostal muscles , just wants to make sure there is no tear, is able to take a deep breath with no pain , can feel two spots that are very tense like a tightness, tylenol for the pain along with ice and those both seem to help, pain when wearing a bra due to the tightness, she states she has sciatic issue thinks that can be radiating from her thoracic area, has tried some PT and that helped, had a CT done  05/18/2023 Patient states that she no longer having sciatic nerve pain. Pain still present in coccyx. Driving makes her pain worse.   Pain in ribs is improving with PT. Lifting and twisting still bother her.     Relevant Historical Information: None pertinent  Additional pertinent review of systems negative.   Current Outpatient Medications:    buPROPion (WELLBUTRIN XL) 150 MG 24 hr tablet, TAKE 1 TABLET BY MOUTH EVERY MORNING, Disp: 30 tablet, Rfl: 0   losartan (COZAAR) 50 MG tablet, Take 50 mg by mouth daily., Disp: , Rfl:    meloxicam (MOBIC) 15 MG tablet, Take 1 tablet (15 mg total) by mouth daily., Disp: 30 tablet, Rfl: 0   Objective:     Vitals:   05/18/23 0851  BP: 122/88  Pulse: 73  SpO2: 96%  Weight: 143 lb (  64.9 kg)  Height: 4\' 11"  (1.499 m)      Body mass index is 28.88 kg/m.    Physical Exam:    General: Well-appearing, cooperative, sitting comfortably in no acute distress.   OMT Physical Exam:  ASIS Compression Test: Positive Right  Thoracic: TTP paraspinal, T5 RRSR Lumbar: TTP paraspinal, L1-3 RRSL Pelvis: Right anterior innominate    Electronically signed by:  [name] [facility] 9:45 AM 05/18/23

## 2023-05-18 ENCOUNTER — Ambulatory Visit: Payer: 59 | Admitting: Sports Medicine

## 2023-05-18 VITALS — BP 122/88 | HR 73 | Ht 59.0 in | Wt 143.0 lb

## 2023-05-18 DIAGNOSIS — M9905 Segmental and somatic dysfunction of pelvic region: Secondary | ICD-10-CM

## 2023-05-18 DIAGNOSIS — M4306 Spondylolysis, lumbar region: Secondary | ICD-10-CM | POA: Diagnosis not present

## 2023-05-18 DIAGNOSIS — M9903 Segmental and somatic dysfunction of lumbar region: Secondary | ICD-10-CM | POA: Diagnosis not present

## 2023-05-18 DIAGNOSIS — M9902 Segmental and somatic dysfunction of thoracic region: Secondary | ICD-10-CM

## 2023-05-18 DIAGNOSIS — G8929 Other chronic pain: Secondary | ICD-10-CM

## 2023-05-18 DIAGNOSIS — M5442 Lumbago with sciatica, left side: Secondary | ICD-10-CM | POA: Diagnosis not present

## 2023-05-18 MED ORDER — MELOXICAM 15 MG PO TABS: 15.0000 mg | ORAL_TABLET | Freq: Every day | ORAL | 0 refills | Status: AC

## 2023-05-18 NOTE — Patient Instructions (Signed)
-   Start meloxicam 15 mg daily x2 weeks.  If still having pain after 2 weeks, complete 3rd-week of meloxicam. May use remaining meloxicam as needed once daily for pain control.  Do not to use additional NSAIDs while taking meloxicam.  May use Tylenol [phone] mg 2 to 3 times a day for breakthrough pain. Recommend getting wedge pillow Continue PT See me again in 4 weeks

## 2023-05-24 ENCOUNTER — Encounter: Payer: 59 | Admitting: Physical Therapy

## 2023-05-24 ENCOUNTER — Encounter: Payer: Self-pay | Admitting: Physical Therapy

## 2023-05-24 DIAGNOSIS — M6281 Muscle weakness (generalized): Secondary | ICD-10-CM | POA: Diagnosis not present

## 2023-05-24 DIAGNOSIS — M533 Sacrococcygeal disorders, not elsewhere classified: Secondary | ICD-10-CM

## 2023-05-24 DIAGNOSIS — R279 Unspecified lack of coordination: Secondary | ICD-10-CM

## 2023-05-24 NOTE — Therapy (Signed)
OUTPATIENT PHYSICAL THERAPY FEMALE PELVIC TREATMENT   Patient Name: [name] MRN: [id number] DOB:June 20, 1976, 47 y.o., female Today's Date: 05/24/2023  END OF SESSION:  PT End of Session - 05/24/23 1037     Visit Number 2    Date for PT Re-Evaluation 08/09/23    Authorization Type [employer]    Authorization - Visit Number 2    Authorization - Number of Visits 57    PT Start Time 1030    PT Stop Time 1115    PT Time Calculation (min) 45 min    Activity Tolerance Patient tolerated treatment well    Behavior During Therapy [facility] for tasks assessed/performed             Past Medical History:  Diagnosis Date   Abnormal Pap smear of cervix 10/2007   colposcopy 12/2007   Anxiety and depression    self-harm 07/2012   HTN complicating peripregnancy, antepartum    Post partum depression    Past Surgical History:  Procedure Laterality Date   CESAREAN SECTION     Patient Active Problem List   Diagnosis Date Noted   Halitosis 01/14/2019   Anxiety and depression    Obesity 10/10/2007    PCP: Farris Has, MD  REFERRING PROVIDER: Marguerita Beards, MD   REFERRING DIAG:  N39.3 (ICD-10-CM) - SUI (stress urinary incontinence, female)  R39.15 (ICD-10-CM) - Urinary urgency  [phone] (ICD-10-CM) - Levator spasm    THERAPY DIAG:  Muscle weakness (generalized)  Coccyx pain  Lack of coordination  Rationale for Evaluation and Treatment: Rehabilitation  ONSET DATE: 2/24  SUBJECTIVE:                                                                                                                                                                                           SUBJECTIVE STATEMENT: I felt okay after the initial eval. I noticed a little difference in the rib cage after the evaluation.    PAIN:  Are you having pain? Yes NPRS scale: 6/10 Pain location:  coccyx  Pain type: burning and sharp, achy Pain description: constant   Aggravating factors: sitting for  1 hour, sitting on harder surface, twisting, over do it with exercise Relieving factors: standing, ice, lay on back with knees up and do deep breathing  PAIN:  Are you having pain? Yes: NPRS scale: 6/10 Pain location: left lateral trunk Pain description: sharp achy Aggravating factors: twisting, lift something heavy Relieving factors: muscle relaxer's, shower, bath    PRECAUTIONS: None  WEIGHT BEARING RESTRICTIONS: No  FALLS:  Has patient fallen in last 6 months? 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  PLOF: Independent  PATIENT GOALS: find relief for the tailbone  PERTINENT HISTORY:  Cesarean section Sexual abuse: No  BOWEL MOVEMENT: no issues  URINATION: Pain with urination: No Fully empty bladder: Yes:   Stream: Strong Urgency: No Frequency: 4-6 times during the day, 0 at night Leakage: Sneezing, Laughing, and Exercise; small leakage Pads: No  INTERCOURSE: deep in pelvis especially if not stretched Pain with intercourse: During Penetration, feels better if she is on top Ability to have vaginal penetration:  Yes:   Climax: Yes Marinoff Scale: 1/3  PREGNANCY: C-section deliveries 2  PROLAPSE: None   OBJECTIVE:   DIAGNOSTIC FINDINGS:  Pelvic floor strength I/V   Pelvic floor musculature: Right levator non-tender, Right obturator non-tender, Left levator tender, Left obturator tender- increased tension present PVR of 68 ml was obtained by bladder scan.    COGNITION: Overall cognitive status: Within functional limits for tasks assessed     SENSATION: Light touch: Appears intact Proprioception: Appears intact   POSTURE: No Significant postural limitations  PELVIC ALIGNMENT:  LUMBARAROM/PROM:  A/PROM A/PROM  eval  Flexion full  Extension Decreased by 25%  Right lateral flexion Decreased by 25% in thoracic  Left lateral flexion full  Right rotation full  Left rotation  Decreased by 25% in thoracic   (Blank rows = not tested)  LOWER EXTREMITY ROM:  Passive ROM Right eval Left eval  Hip external rotation 55 60   (Blank rows = not tested)  LOWER EXTREMITY MMT:  MMT Right eval Left eval  Hip abduction 4/5 4/5  Hip adduction 4/5 4/5   PALPATION:   General  decreased movement of left mid to lower rib cage; decreased movement of T5-T10, tenderness located in left diaphragm; restrictions around the c-section scar                External Perineal Exam tightness along the coccygeus and anus                             Internal Pelvic Floor difficulty with relaxing after contraction of the vaginal and anal area, tenderness along the superior bladder and cervix, puborectalis is very tight, not able to bulge the pelvic floor  Patient confirms identification and approves PT to assess internal pelvic floor and treatment Yes  PELVIC MMT:   MMT eval  Vaginal 2/5  Internal Anal Sphincter 2/5  External Anal Sphincter 2/5  Puborectalis 2/5  (Blank rows = not tested)        TONE: increased  PROLAPSE: none  TODAY'S TREATMENT:    05/24/23 Manual: Soft tissue mobilization: To assess for dry needling Manual work to the external left pelvic floor to elongate after dry needling Manual work to left diaphragm Manual work to the left lateral intercostals at rib 10 and 11 Internal pelvic floor techniques: No emotional/communication barriers or cognitive limitation. Patient is motivated to learn. Patient understands and agrees with treatment goals and plan. PT explains patient will be examined in standing, sitting, and lying down to see how their muscles and joints work. When they are ready, they will be asked to remove their underwear so PT can examine their perineum. The patient is also given the option of providing their own chaperone as one is not provided in our facility. The patient also has the right and is explained the right to defer or refuse any part  of the  evaluation or treatment including the internal exam. With the patient's consent, PT will use one gloved finger to gently assess the muscles of the pelvic floor, seeing how well it contracts and relaxes and if there is muscle symmetry. After, the patient will get dressed and PT and patient will discuss exam findings and plan of care. PT and patient discuss plan of care, schedule, attendance policy and HEP activities. Going through the rectum working on the puborectalis, iliococcygeus, obturator internist, anococcygeal ligament, and distraction of the coccyx Trigger Point Dry-Needling  Treatment instructions: Expect mild to moderate muscle soreness. S/S of pneumothorax if dry needled over a lung field, and to seek immediate medical attention should they occur. Patient verbalized understanding of these instructions and education.  Patient Consent Given: Yes Education handout provided: Yes Muscles treated: left coccygeus and puborectalis Electrical stimulation performed: No Parameters: N/A Treatment response/outcome: trigger point response, elongation of muscle  Neuromuscular re-education: Down training: Diaphragmatic breathing to work on pelvic drop as therapist has her finger in the rectum while giving verbal cues to patient Diaphragmatic breathing with therapist hands on the rib cage to give tactile cues to open up the lower rib cage.  Exercises: Stretches/mobility: Quadruped hip hinging with hips internally rotated                                                                                                                               DATE: 05/17/23  EVAL Manual: Spinal mobilization:      Mobilization to left posterior and lateral mid to lower rib cage      Mobilization to T5-T10 Exercises: Lateral trunk stretch in sitting and standing    PATIENT EDUCATION: 05/24/23 Education details: Access Code: [email or web address] Person educated: Patient Education method: Explanation,  Demonstration, Tactile cues, Verbal cues, and Handouts Education comprehension: verbalized understanding, returned demonstration, verbal cues required, tactile cues required, and needs further education    HOME EXERCISE PROGRAM: 05/24/23 Access Code: [email or web address] URL: [email or web address] Date: 05/24/2023 Prepared by: Eulis Foster  Exercises - Supine Diaphragmatic Breathing  - 1 x daily - 7 x weekly - 1 sets - 10 reps - Seated Diaphragmatic Breathing  - 1 x daily - 7 x weekly - 1 sets - 10 reps - Hip Hinge Rock Back  - 1 x daily - 7 x weekly - 1 sets - 10 reps  Patient Education - Trigger Point Dry Needling  ASSESSMENT:  CLINICAL IMPRESSION: Patient is a 47 y.o. female who was seen today for physical therapy  treatment for levator spasm and SUI.  Patient did well with dry needling. She was able to do a pelvic drop 50% of the time. She was able to open up her lower rib cage better. She had less pain with sitting after manual work.  Patient will benefit from skilled therapy to improve mobility and reduce pain to improve quality of life.   OBJECTIVE IMPAIRMENTS: decreased activity tolerance, decreased coordination, decreased endurance, decreased mobility, decreased  ROM, decreased strength, increased fascial restrictions, and pain.   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   REHAB POTENTIAL: Excellent  CLINICAL DECISION MAKING: Stable/uncomplicated  EVALUATION COMPLEXITY: Low   GOALS: Goals reviewed with patient? Yes  SHORT TERM GOALS: Target date: 06/12/23  Patient independent with stretches to the rib cage and pelvic floor.  Baseline: Goal status: INITIAL  2.  Patient is able to bulge the pelvic  floor due to elongation of the muscles.  Baseline:  Goal status: INITIAL  3.  Patient educated on c-section scar massage to improve tissue mobility.  Baseline:  Goal status: INITIAL   LONG TERM GOALS: Target date: 08/09/23  Patient independent with advanced HEP for hip strength and pelvic floor strength.  Baseline:  Goal status: INITIAL  2.  Patient able to twist her trunk with pain decreased </= 0-1/10 due to improved movement of the thoracic spine and left rib cage.  Baseline:  Goal status: INITIAL  3.  Patient able to sit with coccyx pain decreased </= 0-1/10 due to improve lengthening of the pelvic floor and the ability to relax after a contraction.  Baseline:  Goal status: INITIAL  4.  Patient able to push her stool out when defecating due to improved elongation of the pelvic floor.  Baseline:  Goal status: INITIAL  5.  Patient is able to sneeze, laugh, and exercise without urinary leakage due to the ability to contract and relax the pelvic floor and strength increased >/= 3/5.  Baseline:  Goal status: INITIAL   PLAN:  PT FREQUENCY: 1x/week  PT DURATION: 12 weeks  PLANNED INTERVENTIONS: Therapeutic exercises, Therapeutic activity, Neuromuscular re-education, Patient/Family education, Joint mobilization, Dry Needling, Electrical stimulation, Spinal mobilization, Cryotherapy, Moist heat, scar mobilization, Taping, Ultrasound, Biofeedback, and Manual therapy  PLAN FOR NEXT SESSION: manual therapy to the c-section scar, work on left rib cage and thoracic mobility, diaphragmatic breathing to bulge the pelvic floor, dry needling to the pelvic floor including puborectalis and coccygeus on the right and c-section scar, piriformis stretch    Eulis Foster, PT 05/24/23 11:30 AM

## 2023-05-31 ENCOUNTER — Encounter: Payer: 59 | Attending: Obstetrics and Gynecology | Admitting: Physical Therapy

## 2023-05-31 ENCOUNTER — Encounter: Payer: Self-pay | Admitting: Physical Therapy

## 2023-05-31 DIAGNOSIS — R279 Unspecified lack of coordination: Secondary | ICD-10-CM | POA: Diagnosis present

## 2023-05-31 DIAGNOSIS — M6281 Muscle weakness (generalized): Secondary | ICD-10-CM | POA: Insufficient documentation

## 2023-05-31 DIAGNOSIS — M533 Sacrococcygeal disorders, not elsewhere classified: Secondary | ICD-10-CM | POA: Insufficient documentation

## 2023-05-31 NOTE — Therapy (Addendum)
OUTPATIENT PHYSICAL THERAPY FEMALE PELVIC TREATMENT   Patient Name: [name] MRN: [id number] DOB:Nov 27, 1976, 47 y.o., female Today's Date: 05/31/2023  END OF SESSION:  PT End of Session - 05/31/23 0832     Visit Number 3    Date for PT Re-Evaluation 08/09/23    Authorization Type [employer]    Authorization - Visit Number 3    Authorization - Number of Visits 57    PT Start Time 0830    PT Stop Time 0915    PT Time Calculation (min) 45 min    Activity Tolerance Patient tolerated treatment well    Behavior During Therapy [facility] for tasks assessed/performed             Past Medical History:  Diagnosis Date   Abnormal Pap smear of cervix 10/2007   colposcopy 12/2007   Anxiety and depression    self-harm 07/2012   HTN complicating peripregnancy, antepartum    Post partum depression    Past Surgical History:  Procedure Laterality Date   CESAREAN SECTION     Patient Active Problem List   Diagnosis Date Noted   Halitosis 01/14/2019   Anxiety and depression    Obesity 10/10/2007    PCP: Farris Has, MD  REFERRING PROVIDER: Marguerita Beards, MD   REFERRING DIAG:  N39.3 (ICD-10-CM) - SUI (stress urinary incontinence, female)  R39.15 (ICD-10-CM) - Urinary urgency  [phone] (ICD-10-CM) - Levator spasm    THERAPY DIAG:  Muscle weakness (generalized)  Coccyx pain  Lack of coordination  Rationale for Evaluation and Treatment: Rehabilitation  ONSET DATE: 2/24  SUBJECTIVE:                                                                                                                                                                                           SUBJECTIVE STATEMENT: The needling has helped. I would like more exercises. I finally figured how to expand the back.     PAIN:  Are you having pain? Yes NPRS scale: 6/10 Pain location:  coccyx  Pain type: burning and sharp, achy Pain description: constant   Aggravating factors: sitting for 1 hour,  sitting on harder surface, twisting, over do it with exercise Relieving factors: standing, ice, lay on back with knees up and do deep breathing  PAIN:  Are you having pain? Yes: NPRS scale: 6/10 Pain location: left lateral trunk Pain description: sharp achy Aggravating factors: twisting, lift something heavy Relieving factors: muscle relaxer's, shower, bath    PRECAUTIONS: None  WEIGHT BEARING RESTRICTIONS: No  FALLS:  Has patient fallen in last 6 months? 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  PLOF: Independent  PATIENT GOALS: find relief for the tailbone  PERTINENT HISTORY:  Cesarean section Sexual abuse: No  BOWEL MOVEMENT: no issues  URINATION: Pain with urination: No Fully empty bladder: Yes:   Stream: Strong Urgency: No Frequency: 4-6 times during the day, 0 at night Leakage: Sneezing, Laughing, and Exercise; small leakage Pads: No  INTERCOURSE: deep in pelvis especially if not stretched Pain with intercourse: During Penetration, feels better if she is on top Ability to have vaginal penetration:  Yes:   Climax: Yes Marinoff Scale: 1/3  PREGNANCY: C-section deliveries 2  PROLAPSE: None   OBJECTIVE:   DIAGNOSTIC FINDINGS:  Pelvic floor strength I/V   Pelvic floor musculature: Right levator non-tender, Right obturator non-tender, Left levator tender, Left obturator tender- increased tension present PVR of 68 ml was obtained by bladder scan.    COGNITION: Overall cognitive status: Within functional limits for tasks assessed     SENSATION: Light touch: Appears intact Proprioception: Appears intact   POSTURE: No Significant postural limitations  PELVIC ALIGNMENT:  LUMBARAROM/PROM:  A/PROM A/PROM  eval  Flexion full  Extension Decreased by 25%  Right lateral flexion Decreased by 25% in thoracic  Left lateral flexion full  Right rotation full  Left rotation Decreased by  25% in thoracic   (Blank rows = not tested)  LOWER EXTREMITY ROM:  Passive ROM Right eval Left eval  Hip external rotation 55 60   (Blank rows = not tested)  LOWER EXTREMITY MMT:  MMT Right eval Left eval  Hip abduction 4/5 4/5  Hip adduction 4/5 4/5   PALPATION:   General  decreased movement of left mid to lower rib cage; decreased movement of T5-T10, tenderness located in left diaphragm; restrictions around the c-section scar                External Perineal Exam tightness along the coccygeus and anus                             Internal Pelvic Floor difficulty with relaxing after contraction of the vaginal and anal area, tenderness along the superior bladder and cervix, puborectalis is very tight, not able to bulge the pelvic floor  Patient confirms identification and approves PT to assess internal pelvic floor and treatment Yes  PELVIC MMT:   MMT eval  Vaginal 2/5  Internal Anal Sphincter 2/5  External Anal Sphincter 2/5  Puborectalis 2/5  (Blank rows = not tested)        TONE: increased  PROLAPSE: none  TODAY'S TREATMENT:    05/31/23 Manual: Soft tissue mobilization:  To assess pelvic floor externally around the anus Internal pelvic floor techniques: No emotional/communication barriers or cognitive limitation. Patient is motivated to learn. Patient understands and agrees with treatment goals and plan. PT explains patient will be examined in standing, sitting, and lying down to see how their muscles and joints work. When they are ready, they will be asked to remove their underwear so PT can examine their perineum. The patient is also given the option of providing their own chaperone as one is not provided in our facility. The patient also has the right and is explained the right to defer or refuse any part of the evaluation or treatment including the internal exam. With the patient's consent, PT will use one gloved finger to gently assess the muscles of the pelvic  floor, seeing  how well it contracts and relaxes and if there is muscle symmetry. After, the patient will get dressed and PT and patient will discuss exam findings and plan of care. PT and patient discuss plan of care, schedule, attendance policy and HEP activities.  Going through the anus working on the puborectalis and iliococcygeus with patient performing diaphragmatic breathing to do a pelvic drop to elongate the pelvic floor muscles.  Trigger Point Dry-Needling  Treatment instructions: Expect mild to moderate muscle soreness. S/S of pneumothorax if dry needled over a lung field, and to seek immediate medical attention should they occur. Patient verbalized understanding of these instructions and education.  Patient Consent Given: Yes Education handout provided: Yes Muscles treated: right coccygeus and puborectalis Electrical stimulation performed: No Parameters: N/A Treatment response/outcome: trigger point response, elongation of muscle Neuromuscular re-education: Down training: Diaphragmatic breathing with videos to explain to patient, then breathing in supine then go into childs pose to open the back and relax the pelvic floor further Exercises: Stretches/mobility: Quadruped hip hinge with hips IR 10 x  Thread the needle with trunk rotation upward 10 x each way Strengthening: Bear holding 30 sec Clam  with feet together and feet apart but increased discomfort in coccyx area Hip abduction   05/24/23 Manual: Soft tissue mobilization: To assess for dry needling Manual work to the external left pelvic floor to elongate after dry needling Manual work to left diaphragm Manual work to the left lateral intercostals at rib 10 and 11 Internal pelvic floor techniques: No emotional/communication barriers or cognitive limitation. Patient is motivated to learn. Patient understands and agrees with treatment goals and plan. PT explains patient will be examined in standing, sitting, and lying down  to see how their muscles and joints work. When they are ready, they will be asked to remove their underwear so PT can examine their perineum. The patient is also given the option of providing their own chaperone as one is not provided in our facility. The patient also has the right and is explained the right to defer or refuse any part of the evaluation or treatment including the internal exam. With the patient's consent, PT will use one gloved finger to gently assess the muscles of the pelvic floor, seeing how well it contracts and relaxes and if there is muscle symmetry. After, the patient will get dressed and PT and patient will discuss exam findings and plan of care. PT and patient discuss plan of care, schedule, attendance policy and HEP activities. Going through the rectum working on the puborectalis, iliococcygeus, obturator internist, anococcygeal ligament, and distraction of the coccyx Trigger Point Dry-Needling  Treatment instructions: Expect mild to moderate muscle soreness. S/S of pneumothorax if dry needled over a lung field, and to seek immediate medical attention should they occur. Patient verbalized understanding of these instructions and education.  Patient Consent Given: Yes Education handout provided: Yes Muscles treated: left coccygeus and puborectalis Electrical stimulation performed: No Parameters: N/A Treatment response/outcome: trigger point response, elongation of muscle  Neuromuscular re-education: Down training: Diaphragmatic breathing to work on pelvic drop as therapist has her finger in the rectum while giving verbal cues to patient Diaphragmatic breathing with therapist hands on the rib cage to give tactile cues to open up the lower rib cage.  Exercises: Stretches/mobility: Quadruped hip hinging with hips internally rotated  DATE: 05/17/23   EVAL Manual: Spinal mobilization:      Mobilization to left posterior and lateral mid to lower rib cage      Mobilization to T5-T10 Exercises: Lateral trunk stretch in sitting and standing    PATIENT EDUCATION: 05/24/23 Education details: Access Code: [email or web address] Person educated: Patient Education method: Explanation, Demonstration, Tactile cues, Verbal cues, and Handouts Education comprehension: verbalized understanding, returned demonstration, verbal cues required, tactile cues required, and needs further education    HOME EXERCISE PROGRAM: 05/24/23 Access Code: [email or web address] URL: [email or web address] Date: 05/24/2023 Prepared by: Eulis Foster  Exercises - Supine Diaphragmatic Breathing  - 1 x daily - 7 x weekly - 1 sets - 10 reps - Seated Diaphragmatic Breathing  - 1 x daily - 7 x weekly - 1 sets - 10 reps - Hip Hinge Rock Back  - 1 x daily - 7 x weekly - 1 sets - 10 reps  Patient Education - Trigger Point Dry Needling  ASSESSMENT:  CLINICAL IMPRESSION: Patient is a 47 y.o. female who was seen today for physical therapy  treatment for levator spasm and SUI.  Patient is having less coccyx pain. She was able to sit for 2 hours without pain. She is able to perform pelvic drop with diaphragmatic breathing and expand the posterior trunk.   Patient understands how to perform scar massage to her ceserean scar. Patient will benefit from skilled therapy to improve mobility and reduce pain to improve quality of life.   OBJECTIVE IMPAIRMENTS: decreased activity tolerance, decreased coordination, decreased endurance, decreased mobility, decreased ROM, decreased strength, increased fascial restrictions, and pain.   ACTIVITY LIMITATIONS: sitting, continence, and toileting  PARTICIPATION LIMITATIONS: driving, community activity, and occupation  PERSONAL FACTORS: Time since onset of injury/illness/exacerbation and 1-2 comorbidities: Cesarean section, pars defect at S1-S5  are also  affecting patient's functional outcome.   REHAB POTENTIAL: Excellent  CLINICAL DECISION MAKING: Stable/uncomplicated  EVALUATION COMPLEXITY: Low   GOALS: Goals reviewed with patient? Yes  SHORT TERM GOALS: Target date: 06/12/23  Patient independent with stretches to the rib cage and pelvic floor.  Baseline: Goal status: Met 05/31/23  2.  Patient is able to bulge the pelvic floor due to elongation of the muscles.  Baseline:  Goal status: Met 05/31/23  3.  Patient educated on c-section scar massage to improve tissue mobility.  Baseline:  Goal status: met 05/31/23   LONG TERM GOALS: Target date: 08/09/23  Patient independent with advanced HEP for hip strength and pelvic floor strength.  Baseline:  Goal status: INITIAL  2.  Patient able to twist her trunk with pain decreased </= 0-1/10 due to improved movement of the thoracic spine and left rib cage.  Baseline:  Goal status: INITIAL  3.  Patient able to sit with coccyx pain decreased </= 0-1/10 due to improve lengthening of the pelvic floor and the ability to relax after a contraction.  Baseline:  Goal status: INITIAL  4.  Patient able to push her stool out when defecating due to improved elongation of the pelvic floor.  Baseline:  Goal status: INITIAL  5.  Patient is able to sneeze, laugh, and exercise without urinary leakage due to the ability to contract and relax the pelvic floor and strength increased >/= 3/5.  Baseline:  Goal status: INITIAL   PLAN:  PT FREQUENCY: 1x/week  PT DURATION: 12 weeks  PLANNED INTERVENTIONS: Therapeutic exercises, Therapeutic activity, Neuromuscular re-education, Patient/Family education, Joint mobilization, Dry Needling, Electrical stimulation, Spinal mobilization, Cryotherapy, Moist heat, scar mobilization, Taping,  Ultrasound, Biofeedback, and Manual therapy  PLAN FOR NEXT SESSION:  dry needling to the pelvic floor including puborectalis and coccygeus on the right and c-section scar,  piriformis stretch , progress HEP, patient will look at her work schedule to see when she can come back to therapy.    Eulis Foster, PT 05/31/23 8:32 AM   PHYSICAL THERAPY DISCHARGE SUMMARY  Visits from Start of Care: 3  Current functional level related to goals / functional outcomes: See above. Patient has not returned to therapy since 05/31/23.    Remaining deficits: See above.    Education / Equipment: HEP   Patient agrees to discharge. Patient goals were not met. Patient is being discharged due to not returning since the last visit.Thank you for the referral.  Eulis Foster, PT 08/10/23 1:52 PM

## 2023-07-19 ENCOUNTER — Ambulatory Visit: Payer: 59 | Admitting: Sports Medicine

## 2023-07-19 VITALS — HR 93 | Ht 59.0 in | Wt 138.0 lb

## 2023-07-19 DIAGNOSIS — M79672 Pain in left foot: Secondary | ICD-10-CM

## 2023-07-19 DIAGNOSIS — M4306 Spondylolysis, lumbar region: Secondary | ICD-10-CM

## 2023-07-19 DIAGNOSIS — M9903 Segmental and somatic dysfunction of lumbar region: Secondary | ICD-10-CM | POA: Diagnosis not present

## 2023-07-19 DIAGNOSIS — G8929 Other chronic pain: Secondary | ICD-10-CM

## 2023-07-19 DIAGNOSIS — M9902 Segmental and somatic dysfunction of thoracic region: Secondary | ICD-10-CM

## 2023-07-19 DIAGNOSIS — M9905 Segmental and somatic dysfunction of pelvic region: Secondary | ICD-10-CM

## 2023-07-19 DIAGNOSIS — M5442 Lumbago with sciatica, left side: Secondary | ICD-10-CM

## 2023-07-19 NOTE — Progress Notes (Signed)
[name] [address] [place] [address] Phone: [phone]   Assessment and Plan:     1. Chronic bilateral low back pain with left-sided sciatica 2. Pars defect of lumbar spine 3. Somatic dysfunction of thoracic region 4. Somatic dysfunction of lumbar region 5. Somatic dysfunction of pelvic region -Chronic with exacerbation, subsequent visit - Recurrent flare of low back pain, primarily on left side with patient starting new job - Patient previously had significant relief with meloxicam course, OMT.  Patient completed meloxicam course only 6 weeks ago, so recommend waiting before additional NSAID course - Patient has received relief with OMT in the past.  Elects for repeat OMT today.  Tolerated well per note below. - Decision today to treat with OMT was based on Physical Exam  After verbal consent patient was treated with HVLA (high velocity low amplitude), ME (muscle energy), FPR (flex positional release), ST (soft tissue), PC/PD (Pelvic Compression/ Pelvic Decompression) techniques in   thoracic, lumbar, and pelvic areas. Patient tolerated the procedure well with improvement in symptoms.  Patient educated on potential side effects of soreness and recommended to rest, hydrate, and use Tylenol as needed for pain control.  6. Pain of left heel  -Acute, uncomplicated, initial visit - Suspect left heel pain from walking on hard surfaces - Recommend cushioned and supportive footwear.  Could consider cushioned inserts.  May go to [employer] or other similar store  Pertinent previous records reviewed include none   Follow Up: 4 weeks for reevaluation.  Could consider repeat OMT   Subjective:   I, [name], am serving as a Neurosurgeon for Doctor Richardean Sale   Chief Complaint: left sided rib pain    HPI:    03/26/2023 Patient is a 47 year old female complaining of rib pain. Patient states that 7 years ago she was in bed  twisted and leaned down states she heard a popping noise, she had pain in the intercostal muscles , just wants to make sure there is no tear, is able to take a deep breath with no pain , can feel two spots that are very tense like a tightness, tylenol for the pain along with ice and those both seem to help, pain when wearing a bra due to the tightness, she states she has sciatic issue thinks that can be radiating from her thoracic area, has tried some PT and that helped, had a CT done   05/18/2023 Patient states that she no longer having sciatic nerve pain. Pain still present in coccyx. Driving makes her pain worse.   07/19/2023 Patient states flare of low back pain. And heel some heal pain that has been going on for a couple of months. Thinks her heel pain is related to the pars defect only on the left side    Pain in ribs is improving with PT. Lifting and twisting still bother her.      Relevant Historical Information: None pertinent    Additional pertinent review of systems negative.   Current Outpatient Medications:    buPROPion (WELLBUTRIN XL) 150 MG 24 hr tablet, TAKE 1 TABLET BY MOUTH EVERY MORNING, Disp: 30 tablet, Rfl: 0   losartan (COZAAR) 50 MG tablet, Take 50 mg by mouth daily., Disp: , Rfl:    meloxicam (MOBIC) 15 MG tablet, Take 1 tablet (15 mg total) by mouth daily., Disp: 30 tablet, Rfl: 0   Objective:     Vitals:   07/19/23 1107  Pulse: 93  SpO2: 99%  Weight: 138 lb (62.6 kg)  Height: 4\' 11"  (1.499 m)      Body mass index is 27.87 kg/m.    Physical Exam:    General: Well-appearing, cooperative, sitting comfortably in no acute distress.   OMT Physical Exam:  ASIS Compression Test: Positive left left ight   Thoracic: TTP paraspinal, T5 RRSR, T7-9 RLSR Lumbar: TTP paraspinal, L1-3 RLSR Pelvis: Left posterior innominate    Electronically signed by:  [name] [name] 11:23 AM 07/19/23

## 2023-07-19 NOTE — Patient Instructions (Signed)
4 week follow up  May go to [employer] or similar for footwear recommendations

## 2023-08-16 ENCOUNTER — Ambulatory Visit: Payer: 59 | Admitting: Sports Medicine

## 2023-08-22 NOTE — Progress Notes (Unsigned)
    Aleen Sells D.[facility] [address] [place] [address] Phone: [phone]   Assessment and Plan:     There are no diagnoses linked to this encounter.  ***   Pertinent previous records reviewed include ***   Follow Up: ***     Subjective:   I, Ausha Sieh, am serving as a Neurosurgeon for Doctor Richardean Sale   Chief Complaint: left sided rib pain    HPI:    03/26/2023 Patient is a 47 year old female complaining of rib pain. Patient states that 7 years ago she was in bed twisted and leaned down states she heard a popping noise, she had pain in the intercostal muscles , just wants to make sure there is no tear, is able to take a deep breath with no pain , can feel two spots that are very tense like a tightness, tylenol for the pain along with ice and those both seem to help, pain when wearing a bra due to the tightness, she states she has sciatic issue thinks that can be radiating from her thoracic area, has tried some PT and that helped, had a CT done   05/18/2023 Patient states that she no longer having sciatic nerve pain. Pain still present in coccyx. Driving makes her pain worse.    07/19/2023 Patient states flare of low back pain. And heel some heal pain that has been going on for a couple of months. Thinks her heel pain is related to the pars defect only on the left side    Pain in ribs is improving with PT. Lifting and twisting still bother her.    08/23/2023 Patient states    Relevant Historical Information: None pertinent  Additional pertinent review of systems negative.   Current Outpatient Medications:    buPROPion (WELLBUTRIN XL) 150 MG 24 hr tablet, TAKE 1 TABLET BY MOUTH EVERY MORNING, Disp: 30 tablet, Rfl: 0   losartan (COZAAR) 50 MG tablet, Take 50 mg by mouth daily., Disp: , Rfl:    meloxicam (MOBIC) 15 MG tablet, Take 1 tablet (15 mg total) by mouth daily., Disp: 30 tablet, Rfl: 0   Objective:     There were  no vitals filed for this visit.    There is no height or weight on file to calculate BMI.    Physical Exam:    ***   Electronically signed by:  Aleen Sells [facility] 12:15 PM 08/22/23

## 2023-08-23 ENCOUNTER — Ambulatory Visit: Payer: 59 | Admitting: Sports Medicine

## 2023-08-23 VITALS — HR 102 | Ht 59.0 in | Wt 137.0 lb

## 2023-08-23 DIAGNOSIS — M9904 Segmental and somatic dysfunction of sacral region: Secondary | ICD-10-CM

## 2023-08-23 DIAGNOSIS — G8929 Other chronic pain: Secondary | ICD-10-CM

## 2023-08-23 DIAGNOSIS — M4306 Spondylolysis, lumbar region: Secondary | ICD-10-CM

## 2023-08-23 DIAGNOSIS — M5442 Lumbago with sciatica, left side: Secondary | ICD-10-CM | POA: Diagnosis not present

## 2023-08-23 DIAGNOSIS — M9903 Segmental and somatic dysfunction of lumbar region: Secondary | ICD-10-CM

## 2023-08-23 DIAGNOSIS — M9905 Segmental and somatic dysfunction of pelvic region: Secondary | ICD-10-CM

## 2023-09-27 ENCOUNTER — Ambulatory Visit: Payer: 59 | Admitting: Sports Medicine

## 2024-08-12 ENCOUNTER — Other Ambulatory Visit: Payer: Self-pay | Admitting: Family Medicine

## 2024-08-12 ENCOUNTER — Encounter: Payer: Self-pay | Admitting: Family Medicine

## 2024-08-12 DIAGNOSIS — R928 Other abnormal and inconclusive findings on diagnostic imaging of breast: Secondary | ICD-10-CM

## 2024-08-12 DIAGNOSIS — M79622 Pain in left upper arm: Secondary | ICD-10-CM

## 2024-09-04 ENCOUNTER — Ambulatory Visit: Admitting: Allergy

## 2025-01-05 ENCOUNTER — Encounter: Payer: Self-pay | Admitting: *Deleted

## 2025-01-08 ENCOUNTER — Other Ambulatory Visit: Payer: Self-pay | Admitting: Family Medicine

## 2025-01-08 DIAGNOSIS — R928 Other abnormal and inconclusive findings on diagnostic imaging of breast: Secondary | ICD-10-CM

## 2025-01-08 DIAGNOSIS — M79622 Pain in left upper arm: Secondary | ICD-10-CM

## 2025-01-15 ENCOUNTER — Ambulatory Visit
Admission: RE | Admit: 2025-01-15 | Discharge: 2025-01-15 | Disposition: A | Discharge status: Home / Self Care | Source: Ambulatory Visit | Attending: Family Medicine | Admitting: Family Medicine

## 2025-01-15 DIAGNOSIS — M79622 Pain in left upper arm: Secondary | ICD-10-CM
